# Patient Record
Sex: Male | Born: 1990 | Race: White | Hispanic: No | Marital: Single
Health system: Southern US, Community
[De-identification: ages and names within clinical notes are randomized; demographics above are authoritative.]

## PROBLEM LIST (undated history)

## (undated) ENCOUNTER — Emergency Department: Discharge status: Absent without leave | Payer: Self-pay

## (undated) DIAGNOSIS — K219 Gastro-esophageal reflux disease without esophagitis: Secondary | ICD-10-CM

## (undated) DIAGNOSIS — Z8489 Family history of other specified conditions: Secondary | ICD-10-CM

## (undated) DIAGNOSIS — I10 Essential (primary) hypertension: Secondary | ICD-10-CM

## (undated) DIAGNOSIS — U071 COVID-19: Secondary | ICD-10-CM

---

## 2016-08-30 NOTE — Progress Notes (Signed)
  History of Present Illness:   Tristan Salinas is a 26 y.o. male here for burning with urination. He denies any penile discharge. He is sexually active with his male partner. He denies any abdominal pain, nausea, or vomiting. He does report fever and chills. He reports 2 similar episodes when he was younger, though no work up was done. He is engaged to his partner.   Past Medical History:  No past medical history on file.  Past Surgical History:  No past surgical history on file.  Allergies:  No Known Allergies  Current Medications:   Prior to Admission medications   Medication Sig Taking? Last Dose  cefdinir (OMNICEF) 300 mg capsule Take 1 capsule (300 mg total) by mouth 2 (two) times daily for 10 days.      Family History:  No family history on file.  Social History:   Social History   Social History  . Marital status: Single    Spouse name: N/A  . Number of children: N/A  . Years of education: N/A   Occupational History  . Not on file.   Social History Main Topics  . Smoking status: Never Smoker  . Smokeless tobacco: Never Used  . Alcohol use Not on file  . Drug use: Unknown  . Sexual activity: Not on file   Other Topics Concern  . Not on file   Social History Narrative  . No narrative on file    Review of Systems:   Review of Systems  Constitutional: Negative for chills and fever.  HENT: Negative for congestion.   Eyes: Negative for visual disturbance.  Respiratory: Negative for cough and shortness of breath.   Gastrointestinal: Negative for abdominal pain, nausea and vomiting.  Genitourinary: Positive for dysuria, frequency and urgency. Negative for discharge and penile pain.  Skin: Negative for rash.    Vitals:   Vitals:   08/30/16 0852  BP: 149/87  Pulse: 94  Temp: 37 C (98.6 F)  SpO2: 100%  Weight: 93 kg (205 lb)     Body mass index is 27.05 kg/m.  Physical Exam:   Physical Exam  Constitutional: He is oriented to person, place,  and time. He appears well-developed and well-nourished. No distress.  HENT:  Head: Normocephalic and atraumatic.  Eyes: Conjunctivae and EOM are normal. Pupils are equal, round, and reactive to light.  Cardiovascular: Normal rate and regular rhythm.   Pulmonary/Chest: Effort normal and breath sounds normal.  Abdominal: Soft. Normal appearance and bowel sounds are normal. There is tenderness in the suprapubic area. There is no rigidity, no rebound, no guarding and no CVA tenderness.  Neurological: He is alert and oriented to person, place, and time.  Skin: Skin is warm and dry. He is not diaphoretic.  Psychiatric: He has a normal mood and affect. His behavior is normal. Judgment and thought content normal.  Nursing note and vitals reviewed.   Assessment and Plan:   Tristan Salinas was seen today for fever, chills, dysuria and tingling.  Diagnoses and all orders for this visit:  Dysuria -     Urinalysis w/Microscopic -     Urine Culture, Routine - [facility]  Acute UTI  Other orders -     cefdinir (OMNICEF) 300 mg capsule; Take 1 capsule (300 mg total) by mouth 2 (two) times daily for 10 days.     Portions of this note were created using dictation software and may contain typographical errors.   Patient received an After Visit Summary

## 2019-07-15 ENCOUNTER — Encounter: Payer: Self-pay | Admitting: Emergency Medicine

## 2019-07-15 ENCOUNTER — Other Ambulatory Visit: Payer: Self-pay

## 2019-07-15 ENCOUNTER — Emergency Department
Admission: EM | Admit: 2019-07-15 | Discharge: 2019-07-15 | Disposition: A | Discharge status: Home / Self Care | Payer: Self-pay | Attending: Emergency Medicine | Admitting: Emergency Medicine

## 2019-07-15 ENCOUNTER — Emergency Department: Payer: Self-pay

## 2019-07-15 DIAGNOSIS — F17210 Nicotine dependence, cigarettes, uncomplicated: Secondary | ICD-10-CM | POA: Insufficient documentation

## 2019-07-15 DIAGNOSIS — Z8616 Personal history of COVID-19: Secondary | ICD-10-CM | POA: Insufficient documentation

## 2019-07-15 DIAGNOSIS — K2100 Gastro-esophageal reflux disease with esophagitis, without bleeding: Secondary | ICD-10-CM | POA: Insufficient documentation

## 2019-07-15 DIAGNOSIS — Z79899 Other long term (current) drug therapy: Secondary | ICD-10-CM | POA: Insufficient documentation

## 2019-07-15 DIAGNOSIS — R12 Heartburn: Secondary | ICD-10-CM | POA: Insufficient documentation

## 2019-07-15 HISTORY — DX: COVID-19: U07.1

## 2019-07-15 LAB — CBC
HCT: 47.4 % (ref 39.0–52.0)
Hemoglobin: 15.9 g/dL (ref 13.0–17.0)
MCH: 29.7 pg (ref 26.0–34.0)
MCHC: 33.5 g/dL (ref 30.0–36.0)
MCV: 88.6 fL (ref 80.0–100.0)
Platelets: 242 10*3/uL (ref 150–400)
RBC: 5.35 MIL/uL (ref 4.22–5.81)
RDW: 12.3 % (ref 11.5–15.5)
WBC: 7.8 10*3/uL (ref 4.0–10.5)
nRBC: 0 % (ref 0.0–0.2)

## 2019-07-15 LAB — HEPATIC FUNCTION PANEL
ALT: 76 U/L — ABNORMAL HIGH (ref 0–44)
AST: 35 U/L (ref 15–41)
Albumin: 4.8 g/dL (ref 3.5–5.0)
Alkaline Phosphatase: 55 U/L (ref 38–126)
Bilirubin, Direct: <0.1 mg/dL (ref 0.0–0.2)
Total Bilirubin: 0.6 mg/dL (ref 0.3–1.2)
Total Protein: 8.4 g/dL — ABNORMAL HIGH (ref 6.5–8.1)

## 2019-07-15 LAB — LIPASE, BLOOD: Lipase: 31 U/L (ref 11–51)

## 2019-07-15 LAB — BASIC METABOLIC PANEL
Anion gap: 8 (ref 5–15)
BUN: 9 mg/dL (ref 6–20)
CO2: 29 mmol/L (ref 22–32)
Calcium: 9.8 mg/dL (ref 8.9–10.3)
Chloride: 104 mmol/L (ref 98–111)
Creatinine, Ser: 0.75 mg/dL (ref 0.61–1.24)
GFR calc Af Amer: >60 mL/min (ref >60)
GFR calc non Af Amer: >60 mL/min (ref >60)
Glucose, Bld: 112 mg/dL — ABNORMAL HIGH (ref 70–99)
Potassium: 3.9 mmol/L (ref 3.5–5.1)
Sodium: 141 mmol/L (ref 135–145)

## 2019-07-15 LAB — TROPONIN I (HIGH SENSITIVITY)
Troponin I (High Sensitivity): <2 ng/L (ref <18)
Troponin I (High Sensitivity): <2 ng/L (ref <18)

## 2019-07-15 MED ORDER — FAMOTIDINE 20 MG PO TABS
40.0000 mg | ORAL_TABLET | Freq: Once | ORAL | Status: AC
  Administered 2019-07-15: 40 mg via ORAL
  Filled 2019-07-15: qty 2

## 2019-07-15 MED ORDER — ONDANSETRON HCL 4 MG/2ML IJ SOLN
4.0000 mg | Freq: Once | INTRAMUSCULAR | Status: AC
  Administered 2019-07-15: 16:00:00 4 mg via INTRAVENOUS
  Filled 2019-07-15: qty 2

## 2019-07-15 MED ORDER — PANTOPRAZOLE SODIUM 40 MG IV SOLR
40.0000 mg | Freq: Once | INTRAVENOUS | Status: AC
  Administered 2019-07-15: 16:00:00 40 mg via INTRAVENOUS
  Filled 2019-07-15: qty 40

## 2019-07-15 MED ORDER — ONDANSETRON HCL 4 MG PO TABS: 4.0000 mg | ORAL_TABLET | Freq: Every day | ORAL | 0 refills | Status: AC | PRN

## 2019-07-15 MED ORDER — ALUM & MAG HYDROXIDE-SIMETH 200-200-20 MG/5ML PO SUSP
30.0000 mL | Freq: Once | ORAL | Status: AC
  Administered 2019-07-15: 16:00:00 30 mL via ORAL
  Filled 2019-07-15: qty 30

## 2019-07-15 MED ORDER — LIDOCAINE VISCOUS HCL 2 % MT SOLN
15.0000 mL | Freq: Once | OROMUCOSAL | Status: AC
  Administered 2019-07-15: 15 mL via ORAL
  Filled 2019-07-15: qty 15

## 2019-07-15 MED ORDER — OMEPRAZOLE 20 MG PO CPDR: 20.0000 mg | DELAYED_RELEASE_CAPSULE | Freq: Every day | ORAL | 0 refills | Status: DC

## 2019-07-15 NOTE — ED Notes (Signed)
FIRST NURSE NOTE: Pt seen here today for heart burn sxs, returns again for same sxs states the medication wore off and pain returned.

## 2019-07-15 NOTE — ED Notes (Signed)
Pt transported to Xray. 

## 2019-07-15 NOTE — ED Notes (Signed)
Pt states that he is no longer in pain and denies nausea at this time. Dr. Penne Lash, MD made aware.

## 2019-07-15 NOTE — ED Notes (Signed)
Pt presents to the ED for CP in the central part of the pt's chest since last night. Denies radiation. Pt states he has tried many OTC heartburn medication that would work temporarily and it comes right back. Pt also c/o NVD.

## 2019-07-15 NOTE — ED Notes (Addendum)
PER DR. ISAACS, PT DOES NOT NEED ANY PROTOCOLS AT THIS TIME.

## 2019-07-15 NOTE — ED Notes (Signed)
FIRST NURSE NOTE:  Pt here c/o heart burn since last night/early this morning, reports worse pain with ambulation.     Also of note, when patient was being registered by registration, there was a patient with the same name and DOB but the address listed was not a previous address the patient lived at. Pt was asked several times by this RN and by registration to verify the address but pt was not familiar with the address on file.  Pt was given new MRN with ID that was provided.

## 2019-07-15 NOTE — Discharge Instructions (Addendum)
Start the following regimen. You can purchase these medications over-the-counter, or I have written a paper prescription:  Start Omeprazole 20 mg once daily. Take this first thing in the AM before eating.  Take Tums or other over-the-counter antacid as needed for worsening heartburn  Eat frequent, small meals rather than large meals and avoid foods high in acid, such as juices, tomato sauce, or spicy foods  Try to not eat after 7-8 PM  Try using an extra pillow at night to help with reflux if needed

## 2019-07-15 NOTE — ED Provider Notes (Signed)
Emergency Department Provider Note  ____________________________________________  Time seen: Approximately 9:41 PM  I have reviewed the triage vital signs and the nursing notes.   HISTORY  Chief Complaint Heartburn   Historian Patient    HPI Tristan Salinas is a 29 y.o. male presents to the emergency department with return of heartburn type symptoms.  Patient reports that he was seen and evaluated earlier in the day and given medication which  helped his epigastric abdominal pain.  He states that his symptoms are not as severe as they were earlier but have returned.   Past Medical History:  Diagnosis Date  . COVID-19      Immunizations up to date:  Yes.     Past Medical History:  Diagnosis Date  . COVID-19     There are no problems to display for this patient.   History reviewed. No pertinent surgical history.  Prior to Admission medications   Medication Sig Start Date End Date Taking? Authorizing Provider  omeprazole (PRILOSEC) 20 MG capsule Take 1 capsule (20 mg total) by mouth daily for 14 days. 07/15/19 07/29/19  Duffy Bruce, MD  ondansetron (ZOFRAN) 4 MG tablet Take 1 tablet (4 mg total) by mouth daily as needed for nausea or vomiting. 07/15/19 07/14/20  Duffy Bruce, MD    Allergies Patient has no known allergies.  History reviewed. No pertinent family history.  Social History Social History   Tobacco Use  . Smoking status: Current Some Day Smoker  . Smokeless tobacco: Never Used  Substance Use Topics  . Alcohol use: Yes  . Drug use: Not on file     Review of Systems  Constitutional: No fever/chills Eyes:  No discharge ENT: No upper respiratory complaints. Respiratory: no cough. No SOB/ use of accessory muscles to breath Gastrointestinal:   No nausea, no vomiting.  No diarrhea.  No constipation. Musculoskeletal: Negative for musculoskeletal pain. Skin: Negative for rash, abrasions, lacerations,  ecchymosis.    ____________________________________________   PHYSICAL EXAM:  VITAL SIGNS: ED Triage Vitals  Enc Vitals Group     BP 07/15/19 1849 (!) 166/113     Pulse Rate 07/15/19 1849 69     Resp 07/15/19 1849 18     Temp 07/15/19 1849 98.2 F (36.8 C)     Temp Source 07/15/19 1849 Oral     SpO2 07/15/19 1849 99 %     Weight --      Height --      Head Circumference --      Peak Flow --      Pain Score 07/15/19 1854 10     Pain Loc --      Pain Edu? --      Excl. in [place]? --      Constitutional: Alert and oriented. Well appearing and in no acute distress.  Eyes: Conjunctivae are normal. PERRL. EOMI. Head: Atraumatic. Cardiovascular: Normal rate, regular rhythm. Normal S1 and S2.  Good peripheral circulation. Respiratory: Normal respiratory effort without tachypnea or retractions. Lungs CTAB. Good air entry to the bases with no decreased or absent breath sounds Gastrointestinal: Bowel sounds x 4 quadrants. Soft and nontender to palpation. No guarding or rigidity. No distention. Musculoskeletal: Full range of motion to all extremities. No obvious deformities noted Neurologic:  Normal for age. No gross focal neurologic deficits are appreciated.  Skin:  Skin is warm, dry and intact. No rash noted. Psychiatric: Mood and affect are normal for age. Speech and behavior are normal.   ____________________________________________  LABS (all labs ordered are listed, but only abnormal results are displayed)  Labs Reviewed  TROPONIN I (HIGH SENSITIVITY)  TROPONIN I (HIGH SENSITIVITY)   ____________________________________________  EKG   ____________________________________________  RADIOLOGY Geraldo Pitter, personally viewed and evaluated these images (plain radiographs) as part of my medical decision making, as well as reviewing the written report by the radiologist.  DG Chest 2 View  Result Date: 07/15/2019 CLINICAL DATA:  Chest pain EXAM: CHEST - 2 VIEW  COMPARISON:  None. FINDINGS: Lungs are clear. Heart size and pulmonary vascularity are normal. No adenopathy. No pneumothorax. No bone lesions. IMPRESSION: No abnormality noted. Electronically Signed   By: Bretta Bang III M.D.   On: 07/15/2019 16:19    ____________________________________________    PROCEDURES  Procedure(s) performed:     Procedures     Medications  famotidine (PEPCID) tablet 40 mg (40 mg Oral Given 07/15/19 2004)     ____________________________________________   INITIAL IMPRESSION / ASSESSMENT AND PLAN / ED COURSE  Pertinent labs & imaging results that were available during my care of the patient were reviewed by me and considered in my medical decision making (see chart for details).      Assessment and plan  GERD 29 year old male presents to the emergency department with return of epigastric abdominal discomfort after having an exhaustive work-up earlier in the day.  A repeat troponin was obtained during current emergency department encounter which was within reference range.  Patient was given Pepcid and he reported that his symptoms resolved.  Recommended medications prescribed by Dr. Erma Heritage for GERD.  He voiced understanding and agreed to comply with recommendations.  Return precautions were given.     ____________________________________________  FINAL CLINICAL IMPRESSION(S) / ED DIAGNOSES  Final diagnoses:  Heartburn      NEW MEDICATIONS STARTED DURING THIS VISIT:  ED Discharge Orders    None          This chart was dictated using voice recognition software/Dragon. Despite best efforts to proofread, errors can occur which can change the meaning. Any change was purely unintentional.     Gasper Lloyd 07/15/19 2143    Phineas Semen, MD 07/15/19 2221

## 2019-07-15 NOTE — ED Triage Notes (Signed)
Pt arrived via POV with SO with report of chest pain in the middle of the chest, pt states feels like heart burn and worse today with walking.

## 2019-07-15 NOTE — ED Triage Notes (Signed)
Pt states seen here earlier and was given medications that made symptoms better.  States now the heart burn pain has returned.  Pt denies new symptoms.

## 2019-07-15 NOTE — ED Provider Notes (Signed)
[facility] Emergency Department Provider Note  ____________________________________________   First MD Initiated Contact with Patient 07/15/19 1516     (approximate)  I have reviewed the triage vital signs and the nursing notes.   HISTORY  Chief Complaint Chest Pain    HPI Tristan Salinas is a 29 y.o. male  Here with abdominal pain, nausea, indigestion. Pt reports his sx started last night at around 3 AM. He ate a large chinese food meal late last night. Shortly thereafter, he developed a burning, aching, epigastric pain with radiation up to his chest. He attempted to eat lunch and it made his sx worse so he presents for evaluation. No SOB. No diaphoresis. He has a h/o acid reflux but does not take anything daily for it. No emesis, diarrhea. No blood in stools though he does occasionally note dark stool. Denies any regular NSAID or ASA use.        Past Medical History:  Diagnosis Date  . COVID-19     There are no problems to display for this patient.   History reviewed. No pertinent surgical history.  Prior to Admission medications   Medication Sig Start Date End Date Taking? Authorizing Provider  omeprazole (PRILOSEC) 20 MG capsule Take 1 capsule (20 mg total) by mouth daily for 14 days. 07/15/19 07/29/19  Duffy Bruce, MD  ondansetron (ZOFRAN) 4 MG tablet Take 1 tablet (4 mg total) by mouth daily as needed for nausea or vomiting. 07/15/19 07/14/20  Duffy Bruce, MD    Allergies Patient has no known allergies.  No family history on file.  Social History Social History   Tobacco Use  . Smoking status: Current Some Day Smoker  . Smokeless tobacco: Never Used  Substance Use Topics  . Alcohol use: Yes  . Drug use: Not on file    Review of Systems  Review of Systems  Constitutional: Positive for fatigue. Negative for chills and fever.  HENT: Negative for sore throat.   Respiratory: Negative for shortness of breath.   Cardiovascular:  Negative for chest pain.  Gastrointestinal: Positive for abdominal pain and nausea.  Genitourinary: Negative for flank pain.  Musculoskeletal: Negative for neck pain.  Skin: Negative for rash and wound.  Allergic/Immunologic: Negative for immunocompromised state.  Neurological: Negative for weakness and numbness.  Hematological: Does not bruise/bleed easily.  All other systems reviewed and are negative.    ____________________________________________  PHYSICAL EXAM:      VITAL SIGNS: ED Triage Vitals  Enc Vitals Group     BP 07/15/19 1513 (!) 177/116     Pulse Rate 07/15/19 1513 76     Resp 07/15/19 1513 20     Temp 07/15/19 1513 98 F (36.7 C)     Temp Source 07/15/19 1513 Oral     SpO2 07/15/19 1513 100 %     Weight 07/15/19 1511 220 lb (99.8 kg)     Height 07/15/19 1511 6' (1.829 m)     Head Circumference --      Peak Flow --      Pain Score 07/15/19 1511 7     Pain Loc --      Pain Edu? --      Excl. in [place]? --      Physical Exam Vitals and nursing note reviewed.  Constitutional:      General: He is not in acute distress.    Appearance: He is well-developed.  HENT:     Head: Normocephalic and atraumatic.  Eyes:  Conjunctiva/sclera: Conjunctivae normal.  Cardiovascular:     Rate and Rhythm: Normal rate and regular rhythm.     Heart sounds: Normal heart sounds. No murmur. No friction rub.  Pulmonary:     Effort: Pulmonary effort is normal. No respiratory distress.     Breath sounds: Normal breath sounds. No wheezing or rales.  Abdominal:     General: Bowel sounds are normal. There is no distension.     Palpations: Abdomen is soft.     Tenderness: There is abdominal tenderness in the epigastric area.     Comments: Mild epigastric. No RUQ TTP, neg Murphy's.  Musculoskeletal:     Cervical back: Neck supple.  Skin:    General: Skin is warm.     Capillary Refill: Capillary refill takes less than 2 seconds.  Neurological:     Mental Status: He is alert and  oriented to person, place, and time.     Motor: No abnormal muscle tone.       ____________________________________________   LABS (all labs ordered are listed, but only abnormal results are displayed)  Labs Reviewed  BASIC METABOLIC PANEL - Abnormal; Notable for the following components:      Result Value   Glucose, Bld 112 (*)    All other components within normal limits  HEPATIC FUNCTION PANEL - Abnormal; Notable for the following components:   Total Protein 8.4 (*)    ALT 76 (*)    All other components within normal limits  CBC  LIPASE, BLOOD  TROPONIN I (HIGH SENSITIVITY)  TROPONIN I (HIGH SENSITIVITY)    ____________________________________________  EKG: Normal sinus rhythm, VR 77. PR 158, QRS 106, QTc 423. No acute St elevations or depressions. No acute ischemia or infract. ________________________________________  RADIOLOGY All imaging, including plain films, CT scans, and ultrasounds, independently reviewed by me, and interpretations confirmed via formal radiology reads.  ED MD interpretation:   CXR: Clear, no focal PNA  Official radiology report(s): DG Chest 2 View  Result Date: 07/15/2019 CLINICAL DATA:  Chest pain EXAM: CHEST - 2 VIEW COMPARISON:  None. FINDINGS: Lungs are clear. Heart size and pulmonary vascularity are normal. No adenopathy. No pneumothorax. No bone lesions. IMPRESSION: No abnormality noted. Electronically Signed   By: Bretta Bang III M.D.   On: 07/15/2019 16:19    ____________________________________________  PROCEDURES   Procedure(s) performed (including Critical Care):  Procedures  ____________________________________________  INITIAL IMPRESSION / MDM / ASSESSMENT AND PLAN / ED COURSE  As part of my medical decision making, I reviewed the following data within the electronic MEDICAL RECORD NUMBER Nursing notes reviewed and incorporated, Old chart reviewed, Notes from prior ED visits, and [place] Controlled Substance Database        *[name] was evaluated in Emergency Department on 07/15/2019 for the symptoms described in the history of present illness. He was evaluated in the context of the global COVID-19 pandemic, which necessitated consideration that the patient might be at risk for infection with the SARS-CoV-2 virus that causes COVID-19. Institutional protocols and algorithms that pertain to the evaluation of patients at risk for COVID-19 are in a state of rapid change based on information released by regulatory bodies including the CDC and federal and state organizations. These policies and algorithms were followed during the patient's care in the ED.  Some ED evaluations and interventions may be delayed as a result of limited staffing during the pandemic.*     Medical Decision Making:  29 yo M with no significant past medical  history here with suspected GERD/gastritis, possibly PUD. No signs of significant active bleeding. Abdomen is soft, no focal TTP to suggest cholecystitis, appendicitis, pancreatitis. Lipase, LFTs, all normal and BUN, Hgb are at baseline making bleeding ulcer unlikely. Feels markedly improved with GI cocktail. Will treat with outpt PPI, follow-up as needed. Discussed that he may ultimately benefit from a GI follow-up if his sx persist, given the severity of his sx with young age. EKG non-ischemic, trop neg despite constant sx >6 hr, doubt ACS. EKG and tele without arrhythmia.  ____________________________________________  FINAL CLINICAL IMPRESSION(S) / ED DIAGNOSES  Final diagnoses:  Gastroesophageal reflux disease with esophagitis without hemorrhage     MEDICATIONS GIVEN DURING THIS VISIT:  Medications  alum & mag hydroxide-simeth (MAALOX/MYLANTA) 200-200-20 MG/5ML suspension 30 mL (30 mLs Oral Given 07/15/19 1600)    And  lidocaine (XYLOCAINE) 2 % viscous mouth solution 15 mL (15 mLs Oral Given 07/15/19 1600)  pantoprazole (PROTONIX) injection 40 mg (40 mg Intravenous Given 07/15/19  1604)  ondansetron (ZOFRAN) injection 4 mg (4 mg Intravenous Given 07/15/19 1604)     ED Discharge Orders         Ordered    omeprazole (PRILOSEC) 20 MG capsule  Daily     07/15/19 1601    ondansetron (ZOFRAN) 4 MG tablet  Daily PRN     07/15/19 1601           Note:  This document was prepared using Dragon voice recognition software and may include unintentional dictation errors.   Shaune Pollack, MD 07/15/19 [phone]

## 2020-05-02 ENCOUNTER — Other Ambulatory Visit: Payer: Self-pay

## 2020-05-02 ENCOUNTER — Encounter: Payer: Self-pay | Admitting: Emergency Medicine

## 2020-05-02 ENCOUNTER — Ambulatory Visit
Admission: EM | Admit: 2020-05-02 | Discharge: 2020-05-02 | Disposition: A | Discharge status: Home / Self Care | Payer: 59 | Attending: Physician Assistant | Admitting: Physician Assistant

## 2020-05-02 DIAGNOSIS — K219 Gastro-esophageal reflux disease without esophagitis: Secondary | ICD-10-CM | POA: Diagnosis present

## 2020-05-02 DIAGNOSIS — R1013 Epigastric pain: Secondary | ICD-10-CM

## 2020-05-02 HISTORY — DX: Gastro-esophageal reflux disease without esophagitis: K21.9

## 2020-05-02 LAB — COMPREHENSIVE METABOLIC PANEL
ALT: 112 U/L — ABNORMAL HIGH (ref 0–44)
AST: 43 U/L — ABNORMAL HIGH (ref 15–41)
Albumin: 4.8 g/dL (ref 3.5–5.0)
Alkaline Phosphatase: 54 U/L (ref 38–126)
Anion gap: 10 (ref 5–15)
BUN: 8 mg/dL (ref 6–20)
CO2: 27 mmol/L (ref 22–32)
Calcium: 9.7 mg/dL (ref 8.9–10.3)
Chloride: 103 mmol/L (ref 98–111)
Creatinine, Ser: 0.68 mg/dL (ref 0.61–1.24)
GFR, Estimated: >60 mL/min (ref >60)
Glucose, Bld: 95 mg/dL (ref 70–99)
Potassium: 4 mmol/L (ref 3.5–5.1)
Sodium: 140 mmol/L (ref 135–145)
Total Bilirubin: 0.8 mg/dL (ref 0.3–1.2)
Total Protein: 8.7 g/dL — ABNORMAL HIGH (ref 6.5–8.1)

## 2020-05-02 LAB — CBC WITH DIFFERENTIAL/PLATELET
Abs Immature Granulocytes: 0.03 10*3/uL (ref 0.00–0.07)
Basophils Absolute: 0 10*3/uL (ref 0.0–0.1)
Basophils Relative: 0 %
Eosinophils Absolute: 0.1 10*3/uL (ref 0.0–0.5)
Eosinophils Relative: 2 %
HCT: 47.6 % (ref 39.0–52.0)
Hemoglobin: 16 g/dL (ref 13.0–17.0)
Immature Granulocytes: 0 %
Lymphocytes Relative: 26 %
Lymphs Abs: 2.5 10*3/uL (ref 0.7–4.0)
MCH: 29 pg (ref 26.0–34.0)
MCHC: 33.6 g/dL (ref 30.0–36.0)
MCV: 86.4 fL (ref 80.0–100.0)
Monocytes Absolute: 0.6 10*3/uL (ref 0.1–1.0)
Monocytes Relative: 6 %
Neutro Abs: 6.3 10*3/uL (ref 1.7–7.7)
Neutrophils Relative %: 66 %
Platelets: 272 10*3/uL (ref 150–400)
RBC: 5.51 MIL/uL (ref 4.22–5.81)
RDW: 12.1 % (ref 11.5–15.5)
WBC: 9.6 10*3/uL (ref 4.0–10.5)
nRBC: 0 % (ref 0.0–0.2)

## 2020-05-02 LAB — LIPASE, BLOOD: Lipase: 32 U/L (ref 11–51)

## 2020-05-02 MED ORDER — ALUM & MAG HYDROXIDE-SIMETH 200-200-20 MG/5ML PO SUSP
30.0000 mL | Freq: Once | ORAL | Status: AC
  Administered 2020-05-02: 30 mL via ORAL

## 2020-05-02 MED ORDER — PANTOPRAZOLE SODIUM 40 MG PO TBEC: 40.0000 mg | DELAYED_RELEASE_TABLET | Freq: Two times a day (BID) | ORAL | 0 refills | Status: DC

## 2020-05-02 MED ORDER — LIDOCAINE VISCOUS HCL 2 % MT SOLN
15.0000 mL | Freq: Once | OROMUCOSAL | Status: AC
  Administered 2020-05-02: 15 mL via ORAL

## 2020-05-02 MED ORDER — ALUM & MAG HYDROXIDE-SIMETH 200-200-20 MG/5ML PO SUSP: 30.0000 mL | Freq: Once | ORAL | Status: DC

## 2020-05-02 NOTE — ED Provider Notes (Signed)
[facility]    CSN: [id number] Arrival date & time: 05/02/20  1410      History   Chief Complaint Chief Complaint  Patient presents with   Gastroesophageal Reflux   Abdominal Pain    HPI Tristan Salinas is a 30 y.o. male presenting for epigastric pain x 1 week. Patient has long history of GERD, diagnosed 4 years ago and generally well controlled on omeprazole 20 mg daily. He says he has been having to take 2 to 3 capsules of omeprazole daily to get relief of his acid reflux.He has also been taking Pepto Bismol recently.  Patient states that the pain is a burning sensation that is constant but worse at sometimes.  He says he occasionally vomits and that seems to relieve the pain.  He says he has had a reduced appetite since his symptoms have gotten worse.  He denies any fever, fatigue, radiation of the abdominal pain, diarrhea, hematemesis, dark or bloody stools.  He says symptoms are similar to when he has had bad flareups of his reflux in the past but seemingly worse this time.  He has never seen a GI specialist.  He denies smoking, drinking any alcohol or doing any recreational drugs.  Patient states that he does try to watch his diet and avoid trigger foods.  No other concerns.  HPI  Past Medical History:  Diagnosis Date   COVID-19    GERD (gastroesophageal reflux disease)     There are no problems to display for this patient.   History reviewed. No pertinent surgical history.     Home Medications    Prior to Admission medications   Medication Sig Start Date End Date Taking? Authorizing Provider  omeprazole (PRILOSEC) 20 MG capsule Take 1 capsule (20 mg total) by mouth daily for 14 days. 07/15/19 07/29/19 Yes Shaune Pollack, MD  pantoprazole (PROTONIX) 40 MG tablet Take 1 tablet (40 mg total) by mouth 2 (two) times daily before a meal. 05/02/20 06/01/20 Yes Eusebio Friendly B, PA-C  ondansetron (ZOFRAN) 4 MG tablet Take 1 tablet (4 mg total) by mouth daily as  needed for nausea or vomiting. 07/15/19 07/14/20  Shaune Pollack, MD    Family History History reviewed. No pertinent family history.  Social History Social History   Tobacco Use   Smoking status: Former Smoker   Smokeless tobacco: Never Used  Building services engineer Use: Never used  Substance Use Topics   Alcohol use: Yes   Drug use: Not Currently     Allergies   Patient has no known allergies.   Review of Systems Review of Systems  Constitutional: Positive for appetite change. Negative for fatigue and fever.  Respiratory: Negative for cough and shortness of breath.   Cardiovascular: Negative for chest pain.  Gastrointestinal: Positive for abdominal pain, nausea and vomiting. Negative for blood in stool, constipation and diarrhea.  Musculoskeletal: Negative for back pain.  Neurological: Negative for weakness.     Physical Exam Triage Vital Signs ED Triage Vitals  Enc Vitals Group     BP 05/02/20 1459 (!) 158/88     Pulse Rate 05/02/20 1459 82     Resp 05/02/20 1459 18     Temp 05/02/20 1459 98.2 F (36.8 C)     Temp Source 05/02/20 1459 Oral     SpO2 05/02/20 1459 100 %     Weight 05/02/20 1457 220 lb 0.3 oz (99.8 kg)     Height 05/02/20 1457 6' (1.829 m)  Head Circumference --      Peak Flow --      Pain Score 05/02/20 1457 5     Pain Loc --      Pain Edu? --      Excl. in GC? --    No data found.  Updated Vital Signs BP (!) 158/88 (BP Location: Right Arm)    Pulse 82    Temp 98.2 F (36.8 C) (Oral)    Resp 18    Ht 6' (1.829 m)    Wt 220 lb 0.3 oz (99.8 kg)    SpO2 100%    BMI 29.84 kg/m       Physical Exam Vitals and nursing note reviewed.  Constitutional:      General: He is not in acute distress.    Appearance: Normal appearance. He is well-developed and well-nourished. He is not ill-appearing or toxic-appearing.  HENT:     Head: Normocephalic and atraumatic.  Eyes:     General: No scleral icterus.    Conjunctiva/sclera: Conjunctivae  normal.  Cardiovascular:     Rate and Rhythm: Normal rate and regular rhythm.     Heart sounds: Normal heart sounds.  Pulmonary:     Effort: Pulmonary effort is normal. No respiratory distress.     Breath sounds: Normal breath sounds.  Abdominal:     General: Bowel sounds are normal.     Palpations: Abdomen is soft.     Tenderness: There is no abdominal tenderness. There is no guarding.  Musculoskeletal:        General: No edema.     Cervical back: Neck supple.  Skin:    General: Skin is warm and dry.  Neurological:     General: No focal deficit present.     Mental Status: He is alert. Mental status is at baseline.     Motor: No weakness.  Psychiatric:        Mood and Affect: Mood and affect and mood normal.        Behavior: Behavior normal.        Thought Content: Thought content normal.      UC Treatments / Results  Labs (all labs ordered are listed, but only abnormal results are displayed) Labs Reviewed  COMPREHENSIVE METABOLIC PANEL - Abnormal; Notable for the following components:      Result Value   Total Protein 8.7 (*)    AST 43 (*)    ALT 112 (*)    All other components within normal limits  CBC WITH DIFFERENTIAL/PLATELET  LIPASE, BLOOD    EKG   Radiology No results found.  Procedures Procedures (including critical care time)  Medications Ordered in UC Medications  alum & mag hydroxide-simeth (MAALOX/MYLANTA) 200-200-20 MG/5ML suspension 30 mL (30 mLs Oral Given 05/02/20 1610)    And  lidocaine (XYLOCAINE) 2 % viscous mouth solution 15 mL (15 mLs Oral Given 05/02/20 1610)    Initial Impression / Assessment and Plan / UC Course  I have reviewed the triage vital signs and the nursing notes.  Pertinent labs & imaging results that were available during my care of the patient were reviewed by me and considered in my medical decision making (see chart for details).   30 year old male with known history of GERD presenting for worsening of GERD symptoms  over the past 6 to 7 days.  In the clinic, blood pressure is elevated at 158/88 and the rest of the vital signs are stable.  He is in no acute  distress.  His exam is completely within normal limits including abdominal exam.  He has no tenderness on exam.  Patient given GI cocktail of Maalox/Mylanta and viscous lidocaine  CBC, CMP and lipase obtained today.  Assessing for signs of bleeding ulcer, gallbladder disease, and pancreatitis.  CBC is normal.  Lipase within normal limits.  CMP reveals elevated ALT at 112.  AST is borderline high at 43.  All other labs are normal.  Discussed results with patient.  Treating patient at this time with pantoprazole for severe GERD.  Also discussed identifying trigger foods and ways to reduce risk of worsening GERD.  Patient given contact information for  GI since he likely needs an EGD at this point. Reviewed ED precautions with patient.   Final Clinical Impressions(s) / UC Diagnoses   Final diagnoses:  Abdominal pain, epigastric  Gastroesophageal reflux disease without esophagitis     Discharge Instructions     Begin pantoprazole and stop the omeprazole.  Try to identify trigger foods and avoid them.  Increase rest and fluids.  Eat smaller meals and do not eat 3 hours before bedtime.  To emergency department if you have any fever, severe worsening abdominal pain, you vomit blood or have dark stools.   GI (Gastroenterology) Phone: [phone] Please contact the following number for appointment with GI     ED Prescriptions    Medication Sig Dispense Auth. Provider   pantoprazole (PROTONIX) 40 MG tablet Take 1 tablet (40 mg total) by mouth 2 (two) times daily before a meal. 60 tablet Shirlee Latch, PA-C     PDMP not reviewed this encounter.   Shirlee Latch, PA-C 05/02/20 [phone]

## 2020-05-02 NOTE — Discharge Instructions (Addendum)
Begin pantoprazole and stop the omeprazole.  Try to identify trigger foods and avoid them.  Increase rest and fluids.  Eat smaller meals and do not eat 3 hours before bedtime.  To emergency department if you have any fever, severe worsening abdominal pain, you vomit blood or have dark stools.  [facility] (Gastroenterology) Phone: [phone] Please contact the following number for appointment with GI

## 2020-05-02 NOTE — ED Triage Notes (Signed)
Pt c/o epigastric pain. Started about 6 days ago. He states he had this same pain when he was first diagnosed with GERD. He is taking Omeprazole and pepto.

## 2020-06-19 ENCOUNTER — Other Ambulatory Visit: Payer: Self-pay

## 2020-06-19 ENCOUNTER — Ambulatory Visit (INDEPENDENT_AMBULATORY_CARE_PROVIDER_SITE_OTHER): Payer: 59 | Admitting: Gastroenterology

## 2020-06-19 ENCOUNTER — Encounter: Payer: Self-pay | Admitting: Gastroenterology

## 2020-06-19 VITALS — BP 148/99 | HR 83 | Ht 72.0 in | Wt 215.8 lb

## 2020-06-19 DIAGNOSIS — R109 Unspecified abdominal pain: Secondary | ICD-10-CM

## 2020-06-19 DIAGNOSIS — R748 Abnormal levels of other serum enzymes: Secondary | ICD-10-CM | POA: Diagnosis not present

## 2020-06-19 NOTE — Progress Notes (Unsigned)
Tristan Salinas [address]  [place], [place] [address]  Main: [phone]  Fax: [phone]   Gastroenterology Consultation  Referring Provider:     Sharilyn Sites Primary Care Physician:  Patient, No Pcp Per Reason for Consultation:    Abdominal pain        HPI:    Chief Complaint  Patient presents with  . New Patient (Initial Visit)    Hosp. Follow up    Tristan Salinas is a 30 y.o. y/o male referred for consultation & management  by Dr. Patient, No Pcp Per.  Patient reports abdominal pain, epigastric region, associated with burning sensation in chest, for the last 2 years, intermittent, dull, 5/10, nonradiating, associated with nausea.  Symptoms have worsened over the last 3 to 4 months.  Patient went to the ER for this and was discharged with PPI and conservative management.  Patient was taking PPI twice daily for about a month and states symptoms were much better when he was on the medication and when he ran out, symptoms have started to return.  No dysphagia.  No prior upper endoscopy.  No weight loss.  No prior colonoscopy.  No family history of GI malignancy.  Past Medical History:  Diagnosis Date  . COVID-19   . GERD (gastroesophageal reflux disease)     No past surgical history on file.  Prior to Admission medications   Medication Sig Start Date End Date Taking? Authorizing Provider  ondansetron (ZOFRAN) 4 MG tablet Take 1 tablet (4 mg total) by mouth daily as needed for nausea or vomiting. 07/15/19 07/14/20 Yes Duffy Bruce, MD  omeprazole (PRILOSEC) 20 MG capsule Take 1 capsule (20 mg total) by mouth daily for 14 days. 07/15/19 07/29/19  Duffy Bruce, MD  pantoprazole (PROTONIX) 40 MG tablet Take 1 tablet (40 mg total) by mouth 2 (two) times daily before a meal. 05/02/20 06/01/20  Danton Clap, PA-C    No family history on file.   Social History   Tobacco Use  . Smoking status: Former Research scientist (life sciences)  . Smokeless tobacco: Never Used  Vaping  Use  . Vaping Use: Never used  Substance Use Topics  . Alcohol use: Yes  . Drug use: Not Currently    Allergies as of 06/19/2020  . (No Known Allergies)    Review of Systems:    All systems reviewed and negative except where noted in HPI.   Physical Exam:  BP (!) 148/99 (BP Location: Left Arm, Patient Position: Sitting, Cuff Size: Large)   Pulse 83   Ht 6' (1.829 m)   Wt 215 lb 12.8 oz (97.9 kg)   BMI 29.27 kg/m  No LMP for male patient. Psych:  Alert and cooperative. Normal mood and affect. General:   Alert,  Well-developed, well-nourished, pleasant and cooperative in NAD Head:  Normocephalic and atraumatic. Eyes:  Sclera clear, no icterus.   Conjunctiva pink. Ears:  Normal auditory acuity. Nose:  No deformity, discharge, or lesions. Mouth:  No deformity or lesions,oropharynx pink & moist. Neck:  Supple; no masses or thyromegaly. Abdomen:  Normal bowel sounds.  No bruits.  Soft, non-tender and non-distended without masses, hepatosplenomegaly or hernias noted.  No guarding or rebound tenderness.    Msk:  Symmetrical without gross deformities. Good, equal movement & strength bilaterally. Pulses:  Normal pulses noted. Extremities:  No clubbing or edema.  No cyanosis. Neurologic:  Alert and oriented x3;  grossly normal neurologically. Skin:  Intact without significant lesions or rashes.  No jaundice. Lymph Nodes:  No significant cervical adenopathy. Psych:  Alert and cooperative. Normal mood and affect.   Labs: CBC    Component Value Date/Time   WBC 9.6 05/02/2020 1610   RBC 5.51 05/02/2020 1610   HGB 16.0 05/02/2020 1610   HCT 47.6 05/02/2020 1610   PLT 272 05/02/2020 1610   MCV 86.4 05/02/2020 1610   MCH 29.0 05/02/2020 1610   MCHC 33.6 05/02/2020 1610   RDW 12.1 05/02/2020 1610   LYMPHSABS 2.5 05/02/2020 1610   MONOABS 0.6 05/02/2020 1610   EOSABS 0.1 05/02/2020 1610   BASOSABS 0.0 05/02/2020 1610   CMP     Component Value Date/Time   NA 140 05/02/2020 1610    K 4.0 05/02/2020 1610   CL 103 05/02/2020 1610   CO2 27 05/02/2020 1610   GLUCOSE 95 05/02/2020 1610   BUN 8 05/02/2020 1610   CREATININE 0.68 05/02/2020 1610   CALCIUM 9.7 05/02/2020 1610   PROT 8.7 (H) 05/02/2020 1610   ALBUMIN 4.8 05/02/2020 1610   AST 43 (H) 05/02/2020 1610   ALT 112 (H) 05/02/2020 1610   ALKPHOS 54 05/02/2020 1610   BILITOT 0.8 05/02/2020 1610   GFRNONAA >60 05/02/2020 1610   GFRAA >60 07/15/2019 1534    Imaging Studies: No results found.  Assessment and Plan:   Tristan Salinas is a 30 y.o. y/o male has been referred for abdominal pain  Patient has had elevated transaminases without a clear etiology He denies any history of daily or heavy alcohol use Denies over-the-counter herbal products or supplements.  Does drink 1 cup of chamomile or herbal tea daily.  No heavy Tylenol use.  Given his abdominal pain, elevated liver enzymes, will get right upper quadrant ultrasound.  However, normal total bilirubin and alk phos is reassuring  If right upper quadrant ultrasound does not show any gallbladder etiology of his symptoms, refill PPI versus proceed with EGD  Obtain labs for elevated liver enzymes as well  Patient advised to stay away from hepatotoxic drugs and this was discussed in detail  Patient agreeable with above plan  No alarm symptoms present to indicate urgent endoscopy    Dr Tristan Salinas  Speech recognition software was used to dictate the above note.

## 2020-06-19 NOTE — Patient Instructions (Signed)
Please make sure to be fasting 4 hours before your image.

## 2020-06-24 LAB — CERULOPLASMIN: Ceruloplasmin: 22.8 mg/dL (ref 16.0–31.0)

## 2020-06-24 LAB — HEPATIC FUNCTION PANEL
ALT: 128 IU/L — ABNORMAL HIGH (ref 0–44)
AST: 50 IU/L — ABNORMAL HIGH (ref 0–40)
Albumin: 5.2 g/dL (ref 4.1–5.2)
Alkaline Phosphatase: 66 IU/L (ref 44–121)
Bilirubin Total: 0.4 mg/dL (ref 0.0–1.2)
Bilirubin, Direct: 0.14 mg/dL (ref 0.00–0.40)
Total Protein: 7.9 g/dL (ref 6.0–8.5)

## 2020-06-24 LAB — MITOCHONDRIAL/SMOOTH MUSCLE AB PNL
Mitochondrial Ab: 28 Units — ABNORMAL HIGH (ref 0.0–20.0)
Smooth Muscle Ab: 10 Units (ref 0–19)

## 2020-06-24 LAB — ANTI-MICROSOMAL ANTIBODY LIVER / KIDNEY: LKM1 Ab: 0.7 Units (ref 0.0–20.0)

## 2020-06-24 LAB — IRON AND TIBC
Iron Saturation: 26 % (ref 15–55)
Iron: 90 ug/dL (ref 38–169)
Total Iron Binding Capacity: 346 ug/dL (ref 250–450)
UIBC: 256 ug/dL (ref 111–343)

## 2020-06-24 LAB — HEPATITIS A ANTIBODY, TOTAL: hep A Total Ab: POSITIVE — AB

## 2020-06-24 LAB — HEPATITIS C ANTIBODY: Hep C Virus Ab: <0.1 s/co ratio (ref 0.0–0.9)

## 2020-06-24 LAB — ANA: ANA Titer 1: NEGATIVE

## 2020-06-24 LAB — HEPATITIS B SURFACE ANTIBODY,QUALITATIVE: Hep B Surface Ab, Qual: NONREACTIVE

## 2020-06-24 LAB — HEPATITIS B SURFACE ANTIGEN: Hepatitis B Surface Ag: NEGATIVE

## 2020-06-24 LAB — HEPATITIS B CORE ANTIBODY, TOTAL: Hep B Core Total Ab: NEGATIVE

## 2020-06-24 LAB — FERRITIN: Ferritin: 188 ng/mL (ref 30–400)

## 2020-06-24 LAB — IGG: IgG (Immunoglobin G), Serum: 1343 mg/dL (ref 603–1613)

## 2020-07-03 ENCOUNTER — Ambulatory Visit: Payer: 59

## 2020-09-24 ENCOUNTER — Encounter: Payer: Self-pay | Admitting: Gastroenterology

## 2020-09-24 ENCOUNTER — Other Ambulatory Visit: Payer: Self-pay

## 2020-09-24 ENCOUNTER — Ambulatory Visit (INDEPENDENT_AMBULATORY_CARE_PROVIDER_SITE_OTHER): Payer: 59 | Admitting: Gastroenterology

## 2020-09-24 VITALS — BP 136/76 | HR 73 | Temp 98.0°F | Ht 72.0 in | Wt 207.2 lb

## 2020-09-24 DIAGNOSIS — R109 Unspecified abdominal pain: Secondary | ICD-10-CM | POA: Diagnosis not present

## 2020-09-24 DIAGNOSIS — R748 Abnormal levels of other serum enzymes: Secondary | ICD-10-CM

## 2020-09-24 MED ORDER — PANTOPRAZOLE SODIUM 40 MG PO TBEC: 40.0000 mg | DELAYED_RELEASE_TABLET | Freq: Two times a day (BID) | ORAL | 1 refills | Status: DC

## 2020-09-24 NOTE — Progress Notes (Signed)
Melodie Bouillon, MD [address]  [address]  [place], [place] [address]  Main: [phone]  Fax: [phone]   Primary Care Physician: Marina Goodell, MD   Chief Complaint  Patient presents with   Follow-up    Elevated liver enzymes  Abdominal pain, heartburn  HPI: Tristan Salinas is a 30 y.o. male here for follow-up of elevated liver enzymes.  Antimitochondrial antibody was positive and right upper quadrant ultrasound was ordered but has not been done yet.  Patient reports epigastric pain and heartburn and has been taking pantoprazole. Dull, 5/10, nonradiating, not associated with any nausea or vomiting.  With the pantoprazole he states his heartburn is better, but he does wake up in the mornings, with epigastric pain and some heartburn.  No dysphagia.  No weight loss.  Denies any pruritus or fatigue.   ROS: All ROS reviewed a negative except as per HPI   Past Medical History:  Diagnosis Date   COVID-19    GERD (gastroesophageal reflux disease)     History reviewed. No pertinent surgical history.  Prior to Admission medications   Medication Sig Start Date End Date Taking? Authorizing Provider  pantoprazole (PROTONIX) 40 MG tablet Take 1 tablet (40 mg total) by mouth 2 (two) times daily before a meal. 09/24/20 10/24/20  Pasty Spillers, MD    History reviewed. No pertinent family history.   Social History   Tobacco Use   Smoking status: Former    Pack years: 0.00   Smokeless tobacco: Never  Vaping Use   Vaping Use: Never used  Substance Use Topics   Alcohol use: Yes   Drug use: Not Currently    Allergies as of 09/24/2020   (No Known Allergies)    Physical Examination:   BP 136/76   Pulse 73   Temp 98 F (36.7 C) (Oral)   Ht 6' (1.829 m)   Wt 207 lb 3.2 oz (94 kg)   BMI 28.10 kg/m   Constitutional: General:   Alert,  Well-developed, well-nourished, pleasant and cooperative in NAD BP 136/76   Pulse 73   Temp 98 F (36.7 C)  (Oral)   Ht 6' (1.829 m)   Wt 207 lb 3.2 oz (94 kg)   BMI 28.10 kg/m   Eyes:  Sclera clear, no icterus.   Conjunctiva pink. PERRLA  Ears:  No scars, lesions or masses, Normal auditory acuity. Nose:  No deformity, discharge, or lesions. Mouth:  No deformity or lesions, oropharynx pink & moist.  Neck:  Supple; no masses or thyromegaly.  Respiratory: Normal respiratory effort, Normal percussion  Gastrointestinal:  Normal bowel sounds.  No bruits.  Soft, non-tender and non-distended without masses, hepatosplenomegaly or hernias noted.  No guarding or rebound tenderness.     Cardiac: No clubbing or edema.  No cyanosis. Normal posterior tibial pedal pulses noted.  Lymphatic:  No significant cervical or axillary adenopathy.  Psych:  Alert and cooperative. Normal mood and affect.  Musculoskeletal:  Normal gait. Head normocephalic, atraumatic. Symmetrical without gross deformities. 5/5 Upper and Lower extremity strength bilaterally.  Skin: Warm. Intact without significant lesions or rashes. No jaundice.  Neurologic:  Face symmetrical, tongue midline, Normal sensation to touch;  grossly normal neurologically.  Psych:  Alert and oriented x3, Alert and cooperative. Normal mood and affect.  Labs: CMP     Component Value Date/Time   NA 140 05/02/2020 1610   K 4.0 05/02/2020 1610   CL 103 05/02/2020 1610   CO2 27 05/02/2020  1610   GLUCOSE 95 05/02/2020 1610   BUN 8 05/02/2020 1610   CREATININE 0.68 05/02/2020 1610   CALCIUM 9.7 05/02/2020 1610   PROT 7.9 06/19/2020 1524   ALBUMIN 5.2 06/19/2020 1524   AST 50 (H) 06/19/2020 1524   ALT 128 (H) 06/19/2020 1524   ALKPHOS 66 06/19/2020 1524   BILITOT 0.4 06/19/2020 1524   GFRNONAA >60 05/02/2020 1610   GFRAA >60 07/15/2019 1534   Lab Results  Component Value Date   WBC 9.6 05/02/2020   HGB 16.0 05/02/2020   HCT 47.6 05/02/2020   MCV 86.4 05/02/2020   PLT 272 05/02/2020    Imaging Studies:    Assessment and Plan:    Tristan Salinas is a 30 y.o. y/o male here for follow-up of abdominal pain, heartburn, elevated liver enzymes  Given ongoing heartburn and epigastric pain which may be related to GERD as it did improve somewhat with Protonix, but not completely, will increase Protonix to twice a day  (Risks of PPI use were discussed with patient including bone loss, C. Diff diarrhea, pneumonia, infections, CKD, electrolyte abnormalities.  Pt. Verbalizes understanding and chooses to continue the medication.)  If symptoms no better in 2 weeks patient advised to call us back  Given his elevated liver enzymes and positive antimitochondrial antibody, he will need right upper quadrant ultrasound.  Ordered and our clinic will work on getting this scheduled  I will also repeat liver enzymes at this time since last ones were about 5 to 6 months ago    Dr Melodie Bouillon

## 2020-09-25 LAB — COMPREHENSIVE METABOLIC PANEL
ALT: 84 IU/L — ABNORMAL HIGH (ref 0–44)
AST: 34 IU/L (ref 0–40)
Albumin/Globulin Ratio: 1.8 (ref 1.2–2.2)
Albumin: 4.9 g/dL (ref 4.1–5.2)
Alkaline Phosphatase: 66 IU/L (ref 44–121)
BUN/Creatinine Ratio: 13 (ref 9–20)
BUN: 12 mg/dL (ref 6–20)
Bilirubin Total: 0.5 mg/dL (ref 0.0–1.2)
CO2: 24 mmol/L (ref 20–29)
Calcium: 9.9 mg/dL (ref 8.7–10.2)
Chloride: 102 mmol/L (ref 96–106)
Creatinine, Ser: 0.89 mg/dL (ref 0.76–1.27)
Globulin, Total: 2.8 g/dL (ref 1.5–4.5)
Glucose: 85 mg/dL (ref 65–99)
Potassium: 4.7 mmol/L (ref 3.5–5.2)
Sodium: 141 mmol/L (ref 134–144)
Total Protein: 7.7 g/dL (ref 6.0–8.5)
eGFR: 119 mL/min/{1.73_m2} (ref >59)

## 2020-10-27 ENCOUNTER — Ambulatory Visit
Admission: RE | Admit: 2020-10-27 | Discharge: 2020-10-27 | Disposition: A | Discharge status: Home / Self Care | Payer: 59 | Source: Ambulatory Visit | Attending: Gastroenterology | Admitting: Gastroenterology

## 2020-10-27 ENCOUNTER — Other Ambulatory Visit: Payer: Self-pay

## 2020-10-27 DIAGNOSIS — R109 Unspecified abdominal pain: Secondary | ICD-10-CM | POA: Insufficient documentation

## 2020-10-28 ENCOUNTER — Other Ambulatory Visit: Payer: Self-pay | Admitting: Gastroenterology

## 2020-10-28 DIAGNOSIS — R748 Abnormal levels of other serum enzymes: Secondary | ICD-10-CM

## 2020-11-03 ENCOUNTER — Encounter: Payer: Self-pay | Admitting: Gastroenterology

## 2020-11-28 ENCOUNTER — Other Ambulatory Visit: Payer: Self-pay | Admitting: Gastroenterology

## 2020-12-02 ENCOUNTER — Encounter: Payer: Self-pay | Admitting: Gastroenterology

## 2020-12-02 ENCOUNTER — Ambulatory Visit (INDEPENDENT_AMBULATORY_CARE_PROVIDER_SITE_OTHER): Payer: 59 | Admitting: Gastroenterology

## 2020-12-02 ENCOUNTER — Other Ambulatory Visit: Payer: Self-pay

## 2020-12-02 VITALS — BP 133/86 | HR 73 | Temp 98.1°F | Ht 72.0 in | Wt 208.8 lb

## 2020-12-02 DIAGNOSIS — R1013 Epigastric pain: Secondary | ICD-10-CM | POA: Diagnosis not present

## 2020-12-02 DIAGNOSIS — R7401 Elevation of levels of liver transaminase levels: Secondary | ICD-10-CM | POA: Diagnosis not present

## 2020-12-02 MED ORDER — OMEPRAZOLE 20 MG PO CPDR: 20.0000 mg | DELAYED_RELEASE_CAPSULE | Freq: Two times a day (BID) | ORAL | 0 refills | Status: DC

## 2020-12-02 NOTE — Addendum Note (Signed)
Addended by: Roena Malady on: 12/02/2020 04:17 PM   Modules accepted: Orders, SmartSet

## 2020-12-02 NOTE — Progress Notes (Signed)
Tristan Antigua, MD [address]  [place]  [place], [place] [address]  Main: [phone]  Fax: [phone]   Primary Care Physician: Tristan Hartigan, MD   Chief Complaint  Patient presents with   Follow-up    Pt reports increase epigastric pain x 1-2 weeks... pt states Protonix BID was no longer helping with Sx and stopped taking x 1 week as he was concerned with damage to liver...     HPI: Tristan Salinas is a 30 y.o. male here for follow-up of epigastric pain.  Patient was taking Protonix twice daily that he states was helping his symptoms.  However, a week ago he reports epigastric pain came back so he stopped taking the medication completely.  Describes burning sensation epigastric region and heartburn that occurs usually at nighttime when he is laying down and wakes him up from sleep and he has to sit up and this helps his symptoms.  No dysphagia.  No weight loss.  No nausea or vomiting.  Sleeps on a flat bed  Denies any NSAID use . No altered bowel habits.  Does take NyQuil at night and was advised to look at the bottle when he gets home to ensure that there are no ibuprofen or NSAIDs in it and if so, avoid taking it.   ROS: All ROS reviewed and negative except as per HPI   Past Medical History:  Diagnosis Date   COVID-19    GERD (gastroesophageal reflux disease)     No past surgical history on file.  Prior to Admission medications   Medication Sig Start Date End Date Taking? Authorizing Provider  omeprazole (PRILOSEC) 20 MG capsule Take 1 capsule (20 mg total) by mouth 2 (two) times daily. 12/02/20 01/01/21 Yes Tristan Manifold, MD    No family history on file.   Social History   Tobacco Use   Smoking status: Former   Smokeless tobacco: Never  Scientific laboratory technician Use: Never used  Substance Use Topics   Alcohol use: Yes   Drug use: Not Currently    Allergies as of 12/02/2020   (No Known Allergies)    Physical  Examination:  Constitutional: General:   Alert,  Well-developed, well-nourished, pleasant and cooperative in NAD BP 133/86   Pulse 73   Temp 98.1 F (36.7 C) (Oral)   Ht 6' (1.829 m)   Wt 208 lb 12.8 oz (94.7 kg)   BMI 28.32 kg/m   Respiratory: Normal respiratory effort  Gastrointestinal:  Soft, non-tender and non-distended without masses, hepatosplenomegaly or hernias noted.  No guarding or rebound tenderness.     Cardiac: No clubbing or edema.  No cyanosis. Normal posterior tibial pedal pulses noted.  Psych:  Alert and cooperative. Normal mood and affect.  Musculoskeletal:  Normal gait. Head normocephalic, atraumatic. Symmetrical without gross deformities. 5/5 Lower extremity strength bilaterally.  Skin: Warm. Intact without significant lesions or rashes. No jaundice.  Neck: Supple, trachea midline  Lymph: No cervical lymphadenopathy  Psych:  Alert and oriented x3, Alert and cooperative. Normal mood and affect.  Labs: CMP     Component Value Date/Time   NA 141 09/24/2020 1543   K 4.7 09/24/2020 1543   CL 102 09/24/2020 1543   CO2 24 09/24/2020 1543   GLUCOSE 85 09/24/2020 1543   GLUCOSE 95 05/02/2020 1610   BUN 12 09/24/2020 1543   CREATININE 0.89 09/24/2020 1543   CALCIUM 9.9 09/24/2020 1543   PROT 7.7 09/24/2020 1543   ALBUMIN  4.9 09/24/2020 1543   AST 34 09/24/2020 1543   ALT 84 (H) 09/24/2020 1543   ALKPHOS 66 09/24/2020 1543   BILITOT 0.5 09/24/2020 1543   GFRNONAA >60 05/02/2020 1610   GFRAA >60 07/15/2019 1534   Lab Results  Component Value Date   WBC 9.6 05/02/2020   HGB 16.0 05/02/2020   HCT 47.6 05/02/2020   MCV 86.4 05/02/2020   PLT 272 05/02/2020   November 17, 2020 AST normal at 26, ALT 62,  TSH normal 1.69 CBC Hemoglobin 14.9, normal platelet count at 205   Imaging Studies:   Assessment and Plan:   [name] is a 30 y.o. y/o male here for follow up of epigastric pain due to GERD  Symptoms were improving with PPI, however,  patient started having breakthrough symptoms a week ago and stopped taking the medication completely  Will therefore, switch from Protonix to omeprazole given breakthrough symptoms on Protonix  (Risks of PPI use were discussed with patient including bone loss, C. Diff diarrhea, pneumonia, infections, CKD, electrolyte abnormalities.  Pt. Verbalizes understanding and chooses to continue the medication.)  Patient educated extensively on acid reflux lifestyle modification, including using a bed wedge, not eating 3 hrs before bedtime, diet modifications, and handout given for the same.   EGD also indicated at this time given ongoing symptoms despite PPI therapy, and not explained by right upper quadrant ultrasound  Most recent liver enzymes show persistent elevation in ALT, although improved from before as above.  August 8 labs reviewed and otherwise reassuring with normal platelet count, hemoglobin, TSH.  Therefore, these do not explain his elevated liver enzymes  We will repeat mitochondrial antibody given elevation noted before(ordered)  His alk phos is not elevated to suggest PBC  However, if mitochondrial Ab is elevated again, may need to consider liver biopsy    Dr Tristan Salinas

## 2020-12-02 NOTE — Patient Instructions (Signed)
Please use a bed wedge at night

## 2020-12-03 ENCOUNTER — Encounter: Payer: Self-pay | Admitting: Gastroenterology

## 2020-12-05 LAB — HEPATIC FUNCTION PANEL
ALT: 60 IU/L — ABNORMAL HIGH (ref 0–44)
AST: 25 IU/L (ref 0–40)
Albumin: 5 g/dL (ref 4.1–5.2)
Alkaline Phosphatase: 73 IU/L (ref 44–121)
Bilirubin Total: 0.5 mg/dL (ref 0.0–1.2)
Bilirubin, Direct: 0.12 mg/dL (ref 0.00–0.40)
Total Protein: 7.7 g/dL (ref 6.0–8.5)

## 2020-12-05 LAB — MITOCHONDRIAL ANTIBODIES: Mitochondrial Ab: 25.3 Units — ABNORMAL HIGH (ref 0.0–20.0)

## 2020-12-08 ENCOUNTER — Other Ambulatory Visit: Payer: Self-pay

## 2020-12-08 ENCOUNTER — Encounter
Admission: RE | Disposition: A | Discharge status: Home / Self Care | Payer: Self-pay | Source: Home / Self Care | Attending: Gastroenterology

## 2020-12-08 ENCOUNTER — Encounter: Payer: Self-pay | Admitting: Gastroenterology

## 2020-12-08 ENCOUNTER — Ambulatory Visit: Payer: Self-pay | Admitting: Anesthesiology

## 2020-12-08 ENCOUNTER — Telehealth: Payer: Self-pay

## 2020-12-08 ENCOUNTER — Ambulatory Visit
Admission: RE | Admit: 2020-12-08 | Discharge: 2020-12-08 | Disposition: A | Discharge status: Home / Self Care | Payer: Self-pay | Attending: Gastroenterology | Admitting: Gastroenterology

## 2020-12-08 DIAGNOSIS — Z87891 Personal history of nicotine dependence: Secondary | ICD-10-CM | POA: Insufficient documentation

## 2020-12-08 DIAGNOSIS — K219 Gastro-esophageal reflux disease without esophagitis: Secondary | ICD-10-CM | POA: Insufficient documentation

## 2020-12-08 DIAGNOSIS — Z8616 Personal history of COVID-19: Secondary | ICD-10-CM | POA: Insufficient documentation

## 2020-12-08 DIAGNOSIS — R1013 Epigastric pain: Secondary | ICD-10-CM | POA: Insufficient documentation

## 2020-12-08 DIAGNOSIS — Z79899 Other long term (current) drug therapy: Secondary | ICD-10-CM | POA: Insufficient documentation

## 2020-12-08 DIAGNOSIS — R748 Abnormal levels of other serum enzymes: Secondary | ICD-10-CM

## 2020-12-08 DIAGNOSIS — K2950 Unspecified chronic gastritis without bleeding: Secondary | ICD-10-CM | POA: Insufficient documentation

## 2020-12-08 HISTORY — DX: Family history of other specified conditions: Z84.89

## 2020-12-08 HISTORY — PX: BIOPSY: SHX5522

## 2020-12-08 HISTORY — PX: ESOPHAGOGASTRODUODENOSCOPY (EGD) WITH PROPOFOL: SHX5813

## 2020-12-08 SURGERY — ESOPHAGOGASTRODUODENOSCOPY (EGD) WITH PROPOFOL
Anesthesia: General | Site: Throat

## 2020-12-08 MED ORDER — LIDOCAINE HCL (CARDIAC) PF 100 MG/5ML IV SOSY
PREFILLED_SYRINGE | INTRAVENOUS | Status: DC | PRN
  Administered 2020-12-08: 30 mg via INTRAVENOUS

## 2020-12-08 MED ORDER — SODIUM CHLORIDE 0.9 % IV SOLN: INTRAVENOUS | Status: DC

## 2020-12-08 MED ORDER — PROPOFOL 10 MG/ML IV BOLUS
INTRAVENOUS | Status: DC | PRN
  Administered 2020-12-08: 150 mg via INTRAVENOUS
  Administered 2020-12-08: 50 mg via INTRAVENOUS
  Administered 2020-12-08: 100 mg via INTRAVENOUS

## 2020-12-08 MED ORDER — GLYCOPYRROLATE 0.2 MG/ML IJ SOLN
INTRAMUSCULAR | Status: DC | PRN
  Administered 2020-12-08: .1 mg via INTRAVENOUS

## 2020-12-08 MED ORDER — LACTATED RINGERS IV SOLN: INTRAVENOUS | Status: DC

## 2020-12-08 SURGICAL SUPPLY — 34 items
BALLN DILATOR 10-12 8 (BALLOONS)
BALLN DILATOR 12-15 8 (BALLOONS)
BALLN DILATOR 15-18 8 (BALLOONS)
BALLN DILATOR CRE 0-12 8 (BALLOONS)
BALLN DILATOR ESOPH 8 10 CRE (MISCELLANEOUS) IMPLANT
BALLOON DILATOR 12-15 8 (BALLOONS) IMPLANT
BALLOON DILATOR 15-18 8 (BALLOONS) IMPLANT
BALLOON DILATOR CRE 0-12 8 (BALLOONS) IMPLANT
BLOCK BITE 60FR ADLT L/F GRN (MISCELLANEOUS) ×3 IMPLANT
CLIP HMST 235XBRD CATH ROT (MISCELLANEOUS) IMPLANT
CLIP RESOLUTION 360 11X235 (MISCELLANEOUS)
ELECT REM PT RETURN 9FT ADLT (ELECTROSURGICAL)
ELECTRODE REM PT RTRN 9FT ADLT (ELECTROSURGICAL) IMPLANT
FCP ESCP3.2XJMB 240X2.8X (MISCELLANEOUS)
FORCEPS BIOP RAD 4 LRG CAP 4 (CUTTING FORCEPS) ×3 IMPLANT
FORCEPS BIOP RJ4 240 W/NDL (MISCELLANEOUS)
FORCEPS ESCP3.2XJMB 240X2.8X (MISCELLANEOUS) IMPLANT
GOWN CVR UNV OPN BCK APRN NK (MISCELLANEOUS) ×4 IMPLANT
GOWN ISOL THUMB LOOP REG UNIV (MISCELLANEOUS) ×6
INJECTOR VARIJECT VIN23 (MISCELLANEOUS) IMPLANT
KIT DEFENDO VALVE AND CONN (KITS) IMPLANT
KIT PRC NS LF DISP ENDO (KITS) ×2 IMPLANT
KIT PROCEDURE OLYMPUS (KITS) ×3
MANIFOLD NEPTUNE II (INSTRUMENTS) ×3 IMPLANT
MARKER SPOT ENDO TATTOO 5ML (MISCELLANEOUS) IMPLANT
RETRIEVER NET PLAT FOOD (MISCELLANEOUS) IMPLANT
SNARE SHORT THROW 13M SML OVAL (MISCELLANEOUS) IMPLANT
SNARE SHORT THROW 30M LRG OVAL (MISCELLANEOUS) IMPLANT
SPOT EX ENDOSCOPIC TATTOO (MISCELLANEOUS)
SYR INFLATION 60ML (SYRINGE) IMPLANT
TRAP ETRAP POLY (MISCELLANEOUS) IMPLANT
VARIJECT INJECTOR VIN23 (MISCELLANEOUS)
WATER STERILE IRR 250ML POUR (IV SOLUTION) ×3 IMPLANT
WIRE CRE 18-20MM 8CM F G (MISCELLANEOUS) IMPLANT

## 2020-12-08 NOTE — Telephone Encounter (Signed)
fyi, I already talked to him about need for liver biopsy.he is agreeable, so we just need to order and schedule it

## 2020-12-08 NOTE — Anesthesia Postprocedure Evaluation (Signed)
Anesthesia Post Note  Patient: Tristan Salinas  Procedure(s) Performed: ESOPHAGOGASTRODUODENOSCOPY (EGD) WITH PROPOFOL (Throat) BIOPSY (Esophagus)     Patient location during evaluation: PACU Anesthesia Type: General Level of consciousness: awake and alert Pain management: pain level controlled Vital Signs Assessment: post-procedure vital signs reviewed and stable Respiratory status: spontaneous breathing, nonlabored ventilation, respiratory function stable and patient connected to nasal cannula oxygen Cardiovascular status: blood pressure returned to baseline and stable Postop Assessment: no apparent nausea or vomiting Anesthetic complications: no   No notable events documented.  [name] A  [name]

## 2020-12-08 NOTE — Anesthesia Preprocedure Evaluation (Signed)
Anesthesia Evaluation  Patient identified by MRN, date of birth, ID band Patient awake    Reviewed: Allergy & Precautions, NPO status , Patient's Chart, lab work & pertinent test results, reviewed documented beta blocker date and time   History of Anesthesia Complications Negative for: history of anesthetic complications  Airway Mallampati: II  TM Distance: >3 FB Neck ROM: Full    Dental   Pulmonary former smoker,    breath sounds clear to auscultation       Cardiovascular (-) angina(-) DOE  Rhythm:Regular Rate:Normal     Neuro/Psych    GI/Hepatic GERD  Controlled,  Endo/Other    Renal/GU      Musculoskeletal   Abdominal   Peds  Hematology   Anesthesia Other Findings   Reproductive/Obstetrics                             Anesthesia Physical Anesthesia Plan  ASA: 1  Anesthesia Plan: General   Post-op Pain Management:    Induction: Intravenous  PONV Risk Score and Plan: 2 and Propofol infusion, TIVA and Treatment may vary due to age or medical condition  Airway Management Planned: Natural Airway and Nasal Cannula  Additional Equipment:   Intra-op Plan:   Post-operative Plan:   Informed Consent: I have reviewed the patients History and Physical, chart, labs and discussed the procedure including the risks, benefits and alternatives for the proposed anesthesia with the patient or authorized representative who has indicated his/her understanding and acceptance.       Plan Discussed with: CRNA and Anesthesiologist  Anesthesia Plan Comments:         Anesthesia Quick Evaluation

## 2020-12-08 NOTE — Transfer of Care (Signed)
Immediate Anesthesia Transfer of Care Note  Patient: Tristan Salinas  Procedure(s) Performed: ESOPHAGOGASTRODUODENOSCOPY (EGD) WITH PROPOFOL (Throat) BIOPSY (Esophagus)  Patient Location: PACU  Anesthesia Type: General  Level of Consciousness: awake, alert  and patient cooperative  Airway and Oxygen Therapy: Patient Spontanous Breathing and Patient connected to supplemental oxygen  Post-op Assessment: Post-op Vital signs reviewed, Patient's Cardiovascular Status Stable, Respiratory Function Stable, Patent Airway and No signs of Nausea or vomiting  Post-op Vital Signs: Reviewed and stable  Complications: No notable events documented.

## 2020-12-08 NOTE — Op Note (Signed)
[facility] Gastroenterology Patient Name: Tristan Salinas Procedure Date: 12/08/2020 1:01 PM MRN: [id number] Account #: [id number] Date of Birth: 1990-04-14 Admit Type: Outpatient Age: 30 Room: [facility] OR ROOM 01 Gender: Male Note Status: Finalized Procedure:             Upper GI endoscopy Indications:           Epigastric abdominal pain Providers:             Malky Rudzinski B. Maximino Greenland MD, MD Referring MD:          Marina Goodell (Referring MD) Medicines:             Monitored Anesthesia Care Complications:         No immediate complications. Procedure:             Pre-Anesthesia Assessment:                        - Prior to the procedure, a History and Physical was                         performed, and patient medications, allergies and                         sensitivities were reviewed. The patient's tolerance                         of previous anesthesia was reviewed.                        - The risks and benefits of the procedure and the                         sedation options and risks were discussed with the                         patient. All questions were answered and informed                         consent was obtained.                        - Patient identification and proposed procedure were                         verified prior to the procedure by the physician, the                         nurse, the anesthesiologist, the anesthetist and the                         technician. The procedure was verified in the                         procedure room.                        - ASA Grade Assessment: II - A patient with mild                         systemic disease.  After obtaining informed consent, the endoscope was                         passed under direct vision. Throughout the procedure,                         the patient's blood pressure, pulse, and oxygen                         saturations were monitored  continuously. The Endoscope                         was introduced through the mouth, and advanced to the                         second part of duodenum. The upper GI endoscopy was                         accomplished with ease. The patient tolerated the                         procedure well. Findings:      The examined esophagus was normal.      The entire examined stomach was normal. Biopsies were obtained in the       gastric body, at the incisura and in the gastric antrum with cold       forceps for histology. Biopsies were taken with a cold forceps for       Helicobacter pylori testing.      There is no endoscopic evidence of inflammation or ulceration in the       entire examined stomach.      The duodenal bulb, second portion of the duodenum and examined duodenum       were normal. Impression:            - Normal esophagus.                        - Normal stomach. Biopsied.                        - Normal duodenal bulb, second portion of the duodenum                         and examined duodenum.                        - Biopsies were obtained in the gastric body, at the                         incisura and in the gastric antrum. Recommendation:        - Await pathology results.                        - Discharge patient to home (with escort).                        - Advance diet as tolerated.                        -  Continue present medications.                        - Patient has a contact number available for                         emergencies. The signs and symptoms of potential                         delayed complications were discussed with the patient.                         Return to normal activities tomorrow. Written                         discharge instructions were provided to the patient.                        - Discharge patient to home (with escort).                        - The findings and recommendations were discussed with                          the patient.                        - The findings and recommendations were discussed with                         the patient's family. Procedure Code(s):     --- Professional ---                        [phone], Esophagogastroduodenoscopy, flexible,                         transoral; with biopsy, single or multiple Diagnosis Code(s):     --- Professional ---                        R10.13, Epigastric pain CPT copyright 2019 American Medical Association. All rights reserved. The codes documented in this report are preliminary and upon coder review may  be revised to meet current compliance requirements.  Melodie Bouillon, MD Michel Bickers B. Maximino Greenland MD, MD 12/08/2020 1:18:53 PM This report has been signed electronically. Number of Addenda: 0 Note Initiated On: 12/08/2020 1:01 PM Total Procedure Duration: 0 hours 5 minutes 49 seconds  Estimated Blood Loss:  Estimated blood loss: none.      [facility]

## 2020-12-08 NOTE — Anesthesia Procedure Notes (Signed)
Date/Time: 12/08/2020 1:07 PM Performed by: Maree Krabbe, CRNA Pre-anesthesia Checklist: Patient identified, Emergency Drugs available, Suction available, Timeout performed and Patient being monitored Patient Re-evaluated:Patient Re-evaluated prior to induction Oxygen Delivery Method: Nasal cannula Placement Confirmation: positive ETCO2

## 2020-12-08 NOTE — H&P (Signed)
Melodie Bouillon, MD [address], [place], [place], [address] [address], [address], [place], [place], [address] Phone: [phone]  Fax: [phone]  Primary Care Physician:  Marina Goodell, MD   Pre-Procedure History & Physical: HPI:  Tristan Salinas is a 30 y.o. male is here for an EGD.   Past Medical History:  Diagnosis Date   COVID-19    Family history of adverse reaction to anesthesia    Mother - slow to wake   GERD (gastroesophageal reflux disease)     History reviewed. No pertinent surgical history.  Prior to Admission medications   Medication Sig Start Date End Date Taking? Authorizing Provider  losartan (COZAAR) 50 MG tablet Take 50 mg by mouth daily.   Yes [provider]  omeprazole (PRILOSEC) 20 MG capsule Take 1 capsule (20 mg total) by mouth 2 (two) times daily. 12/02/20 01/01/21 Yes Pasty Spillers, MD    Allergies as of 12/02/2020   (No Known Allergies)    History reviewed. No pertinent family history.  Social History   Socioeconomic History   Marital status: Single    Spouse name: Not on file   Number of children: Not on file   Years of education: Not on file   Highest education level: Not on file  Occupational History   Not on file  Tobacco Use   Smoking status: Former    Years: 1.00    Types: Cigarettes, E-cigarettes    Quit date: 10/10/2020    Years since quitting: 0.1   Smokeless tobacco: Never   Tobacco comments:    Smoked and vaped "off and on" for about 1 year  Vaping Use   Vaping Use: Former  Substance and Sexual Activity   Alcohol use: Not Currently   Drug use: Not Currently   Sexual activity: Not on file  Other Topics Concern   Not on file  Social History Narrative   Not on file   Social Determinants of Health   Financial Resource Strain: Not on file  Food Insecurity: Not on file  Transportation Needs: Not on file  Physical Activity: Not on file  Stress: Not on file  Social Connections:  Not on file  Intimate Partner Violence: Not on file    Review of Systems: See HPI, otherwise negative ROS  Constitutional: General:   Alert,  Well-developed, well-nourished, pleasant and cooperative in NAD BP (!) 143/98   Pulse 79   Temp 98.2 F (36.8 C) (Temporal)   Resp 20   Ht 6' (1.829 m)   Wt 93.9 kg   SpO2 100%   BMI 28.07 kg/m   Head: Normocephalic, atraumatic.   Eyes:  Sclera clear, no icterus.   Conjunctiva pink.   Mouth:  No deformity or lesions, oropharynx pink & moist.  Neck:  Supple, trachea midline  Respiratory: Normal respiratory effort  Gastrointestinal:  Soft, non-tender and non-distended without masses, hepatosplenomegaly or hernias noted.  No guarding or rebound tenderness.     Cardiac: No clubbing or edema.  No cyanosis. Normal posterior tibial pedal pulses noted.  Lymphatic:  No significant cervical adenopathy.  Psych:  Alert and cooperative. Normal mood and affect.  Musculoskeletal:   Symmetrical without gross deformities. 5/5 Lower extremity strength bilaterally.  Skin: Warm. Intact without significant lesions or rashes. No jaundice.  Neurologic:  Face symmetrical, tongue midline, Normal sensation to touch;  grossly normal neurologically.  Psych:  Alert and oriented x3, Alert and cooperative. Normal mood and affect.  Impression/Plan: Kern Alberta  Avakian is here for an EGD for Abdominal pain  Risks, benefits, limitations, and alternatives regarding the procedure have been reviewed with the patient.  Questions have been answered.  All parties agreeable.   Pasty Spillers, MD  12/08/2020, 12:41 PM

## 2020-12-09 ENCOUNTER — Encounter: Payer: Self-pay | Admitting: Gastroenterology

## 2020-12-11 LAB — SURGICAL PATHOLOGY

## 2020-12-16 NOTE — Addendum Note (Signed)
Addended by: Roena Malady on: 12/16/2020 10:22 AM   Modules accepted: Orders

## 2020-12-16 NOTE — Telephone Encounter (Signed)
What labs did you want ordered with this biopsy?

## 2020-12-18 ENCOUNTER — Encounter: Payer: Self-pay | Admitting: Gastroenterology

## 2020-12-18 NOTE — Addendum Note (Signed)
Addended by: Roena Malady on: 12/18/2020 11:51 AM   Modules accepted: Orders

## 2020-12-18 NOTE — Telephone Encounter (Signed)
Left message on voicemail letting pt know that he is scheduled at [facility] entrance for Sept 14th arrive at 7:30am for liver biopsy... Also, a nurse from their office will give him a call on Mon or Tues to go over procedure

## 2020-12-22 ENCOUNTER — Other Ambulatory Visit: Payer: Self-pay | Admitting: Radiology

## 2020-12-22 NOTE — Telephone Encounter (Signed)
Left detailed msg on VM per HIPAA  

## 2020-12-22 NOTE — Progress Notes (Signed)
Patient on schedule for liver biopsy 9/14, unable to get answer on phone with patient, thus left message to be here @ 0730, NPO after MN prior to procedure as well as driver post procedure/recovery/discharge.

## 2020-12-23 ENCOUNTER — Other Ambulatory Visit: Payer: Self-pay | Admitting: Radiology

## 2020-12-24 ENCOUNTER — Ambulatory Visit
Admission: RE | Admit: 2020-12-24 | Discharge: 2020-12-24 | Disposition: A | Discharge status: Home / Self Care | Payer: 59 | Source: Ambulatory Visit | Attending: Gastroenterology | Admitting: Gastroenterology

## 2020-12-24 ENCOUNTER — Other Ambulatory Visit: Payer: Self-pay

## 2020-12-24 DIAGNOSIS — Z79899 Other long term (current) drug therapy: Secondary | ICD-10-CM | POA: Insufficient documentation

## 2020-12-24 DIAGNOSIS — Z8616 Personal history of COVID-19: Secondary | ICD-10-CM | POA: Diagnosis not present

## 2020-12-24 DIAGNOSIS — K219 Gastro-esophageal reflux disease without esophagitis: Secondary | ICD-10-CM | POA: Insufficient documentation

## 2020-12-24 DIAGNOSIS — Z87891 Personal history of nicotine dependence: Secondary | ICD-10-CM | POA: Insufficient documentation

## 2020-12-24 DIAGNOSIS — R748 Abnormal levels of other serum enzymes: Secondary | ICD-10-CM | POA: Diagnosis not present

## 2020-12-24 HISTORY — DX: Essential (primary) hypertension: I10

## 2020-12-24 LAB — CBC
HCT: 43.7 % (ref 39.0–52.0)
Hemoglobin: 15.5 g/dL (ref 13.0–17.0)
MCH: 30.8 pg (ref 26.0–34.0)
MCHC: 35.5 g/dL (ref 30.0–36.0)
MCV: 86.9 fL (ref 80.0–100.0)
Platelets: 209 10*3/uL (ref 150–400)
RBC: 5.03 MIL/uL (ref 4.22–5.81)
RDW: 11.9 % (ref 11.5–15.5)
WBC: 5.9 10*3/uL (ref 4.0–10.5)
nRBC: 0 % (ref 0.0–0.2)

## 2020-12-24 LAB — PROTIME-INR
INR: 1 (ref 0.8–1.2)
Prothrombin Time: 12.8 seconds (ref 11.4–15.2)

## 2020-12-24 MED ORDER — FENTANYL CITRATE (PF) 100 MCG/2ML IJ SOLN
INTRAMUSCULAR | Status: AC
  Filled 2020-12-24: qty 2

## 2020-12-24 MED ORDER — MIDAZOLAM HCL 2 MG/2ML IJ SOLN
INTRAMUSCULAR | Status: DC | PRN
  Administered 2020-12-24: 1 mg via INTRAVENOUS

## 2020-12-24 MED ORDER — MIDAZOLAM HCL 2 MG/2ML IJ SOLN
INTRAMUSCULAR | Status: AC
  Filled 2020-12-24: qty 2

## 2020-12-24 MED ORDER — FENTANYL CITRATE (PF) 100 MCG/2ML IJ SOLN
INTRAMUSCULAR | Status: DC | PRN
  Administered 2020-12-24 (×2): 50 ug via INTRAVENOUS

## 2020-12-24 MED ORDER — MIDAZOLAM HCL 5 MG/5ML IJ SOLN
INTRAMUSCULAR | Status: DC | PRN
  Administered 2020-12-24: 1 mg via INTRAVENOUS

## 2020-12-24 MED ORDER — SODIUM CHLORIDE 0.9 % IV SOLN: INTRAVENOUS | Status: DC

## 2020-12-24 NOTE — H&P (Signed)
Chief Complaint: Patient was seen in consultation today for elevated mitochondrial antibody at the request of TristanVarnita Salinas  Referring Physician(s): TristanVarnita Salinas  Supervising Physician: Pernell Dupre  Patient Status: [facility] - Out-pt  History of Present Illness: Tristan Salinas is a 30 y.o. male who has been seen by GI on 8/23 with findings of elevated ALT and elevated mitochondrial antibody. Request was received for image guided non target liver biopsy. The patient has had a H&P performed within the last 30 days, all history, medications, and exam have been reviewed. The patient denies any interval changes since the H&P.  The patient denies any current abdominal pain, chest pain, shortness of breath or palpitations. He denies any known bleeding or clotting disorder. He has no known complications to sedation.    Past Medical History:  Diagnosis Date   COVID-19    Family history of adverse reaction to anesthesia    Mother - slow to wake   GERD (gastroesophageal reflux disease)    Hypertension     Past Surgical History:  Procedure Laterality Date   BIOPSY N/A 12/08/2020   Procedure: BIOPSY;  Surgeon: Pasty Spillers, MD;  Location: [facility];  Service: Endoscopy;  Laterality: N/A;   ESOPHAGOGASTRODUODENOSCOPY (EGD) WITH PROPOFOL N/A 12/08/2020   Procedure: ESOPHAGOGASTRODUODENOSCOPY (EGD) WITH PROPOFOL;  Surgeon: Pasty Spillers, MD;  Location: [facility];  Service: Endoscopy;  Laterality: N/A;    Allergies: Patient has no known allergies.  Medications: Prior to Admission medications   Medication Sig Start Date End Date Taking? Authorizing Provider  losartan (COZAAR) 50 MG tablet Take 50 mg by mouth daily.   Yes [provider]  omeprazole (PRILOSEC) 20 MG capsule Take 1 capsule (20 mg total) by mouth 2 (two) times daily. 12/02/20 01/01/21 Yes Pasty Spillers, MD     History reviewed. No pertinent family  history.  Social History   Socioeconomic History   Marital status: Single    Spouse name: Not on file   Number of children: Not on file   Years of education: Not on file   Highest education level: Not on file  Occupational History   Not on file  Tobacco Use   Smoking status: Former    Years: 1.00    Types: Cigarettes, E-cigarettes    Quit date: 10/10/2020    Years since quitting: 0.2   Smokeless tobacco: Never   Tobacco comments:    Smoked and vaped "off and on" for about 1 year  Vaping Use   Vaping Use: Former  Substance and Sexual Activity   Alcohol use: Not Currently   Drug use: Not Currently   Sexual activity: Not on file  Other Topics Concern   Not on file  Social History Narrative   Not on file   Social Determinants of Health   Financial Resource Strain: Not on file  Food Insecurity: Not on file  Transportation Needs: Not on file  Physical Activity: Not on file  Stress: Not on file  Social Connections: Not on file    Review of Systems: A 12 point ROS discussed and pertinent positives are indicated in the HPI above.  All other systems are negative.  Review of Systems  Vital Signs: BP 139/83   Pulse 79   Temp 98.4 F (36.9 C) (Oral)   Resp 16   Ht 6' (1.829 m)   Wt 205 lb (93 kg)   SpO2 100%   BMI 27.80 kg/m   Physical Exam Constitutional:  Appearance: Normal appearance.  HENT:     Head: Normocephalic and atraumatic.  Cardiovascular:     Rate and Rhythm: Normal rate and regular rhythm.  Pulmonary:     Effort: Pulmonary effort is normal. No respiratory distress.     Breath sounds: Normal breath sounds.  Abdominal:     General: There is no distension.     Palpations: Abdomen is soft. There is no mass.     Tenderness: There is no abdominal tenderness.  Skin:    General: Skin is warm and dry.  Neurological:     Mental Status: He is alert and oriented to person, place, and time.    Imaging: No results found.  Labs:  CBC: Recent Labs     05/02/20 1610 12/24/20 0807  WBC 9.6 5.9  HGB 16.0 15.5  HCT 47.6 43.7  PLT 272 209    COAGS: Recent Labs    12/24/20 0807  INR 1.0    BMP: Recent Labs    05/02/20 1610 09/24/20 1543  NA 140 141  K 4.0 4.7  CL 103 102  CO2 27 24  GLUCOSE 95 85  BUN 8 12  CALCIUM 9.7 9.9  CREATININE 0.68 0.89  GFRNONAA >60  --     LIVER FUNCTION TESTS: Recent Labs    05/02/20 1610 06/19/20 1524 09/24/20 1543 12/02/20 1547  BILITOT 0.8 0.4 0.5 0.5  AST 43* 50* 34 25  ALT 112* 128* 84* 60*  ALKPHOS 54 66 66 73  PROT 8.7* 7.9 7.7 7.7  ALBUMIN 4.8 5.2 4.9 5.0    Assessment and Plan: 30 year old male with no significant PMHx except GERD who has been seen by GI on 8/23 with findings of elevated ALT and elevated mitochondrial antibody.  IR received request for non target liver biopsy for further evaluation.    The patient has been NPO, no blood thinners taken, labs and vitals have been reviewed.  Risks and benefits of image guided liver biopsy was discussed with the patient and/or patient's family including, but not limited to bleeding, infection, damage to adjacent structures or low yield requiring additional tests.  All of the questions were answered and there is agreement to proceed.  Consent signed and in chart.  Thank you for this interesting consult.  I greatly enjoyed meeting Scott Vanderveer and look forward to participating in their care.  A copy of this report was sent to the requesting provider on this date.  Electronically Signed: Berneta Levins, PA-C 12/24/2020, 8:30 AM   I spent a total of 15 Minutes in face to face in clinical consultation, greater than 50% of which was counseling/coordinating care for abnormal liver labs.

## 2020-12-24 NOTE — Progress Notes (Signed)
Patient clinically stable post Liver biopsy per Dr Juliette Alcide, tolerated well. Denies complaints post procedure. Received Versed 2 mg along with Fentanyl 100 mcg IV for procedure. Report given to Weldon Picking Rn post procedure/specials.

## 2020-12-24 NOTE — Procedures (Signed)
Interventional Radiology Procedure Note  Date of Procedure: 12/24/2020  Procedure: Random liver biopsy   Findings:  1. Successful US guided random liver biopsy of the right lobe 18ga x3 passes    Complications: No immediate complications noted.   Estimated Blood Loss: minimal  Follow-up and Recommendations: 1. Bedrest 3 hours, first hour with right side down    Olive Bass, MD  Vascular & Interventional Radiology  12/24/2020 9:15 AM

## 2020-12-26 LAB — SURGICAL PATHOLOGY

## 2020-12-28 ENCOUNTER — Emergency Department
Admission: EM | Admit: 2020-12-28 | Discharge: 2020-12-28 | Disposition: A | Discharge status: Home / Self Care | Payer: 59 | Attending: Emergency Medicine | Admitting: Emergency Medicine

## 2020-12-28 ENCOUNTER — Other Ambulatory Visit: Payer: Self-pay

## 2020-12-28 ENCOUNTER — Emergency Department: Payer: 59

## 2020-12-28 ENCOUNTER — Encounter: Payer: Self-pay | Admitting: Emergency Medicine

## 2020-12-28 DIAGNOSIS — Z79899 Other long term (current) drug therapy: Secondary | ICD-10-CM | POA: Insufficient documentation

## 2020-12-28 DIAGNOSIS — I10 Essential (primary) hypertension: Secondary | ICD-10-CM | POA: Diagnosis not present

## 2020-12-28 DIAGNOSIS — Z87891 Personal history of nicotine dependence: Secondary | ICD-10-CM | POA: Diagnosis not present

## 2020-12-28 DIAGNOSIS — K219 Gastro-esophageal reflux disease without esophagitis: Secondary | ICD-10-CM | POA: Insufficient documentation

## 2020-12-28 DIAGNOSIS — K8020 Calculus of gallbladder without cholecystitis without obstruction: Secondary | ICD-10-CM | POA: Diagnosis not present

## 2020-12-28 DIAGNOSIS — Z8616 Personal history of COVID-19: Secondary | ICD-10-CM | POA: Diagnosis not present

## 2020-12-28 DIAGNOSIS — R1013 Epigastric pain: Secondary | ICD-10-CM

## 2020-12-28 LAB — CBC WITH DIFFERENTIAL/PLATELET
Abs Immature Granulocytes: 0.05 10*3/uL (ref 0.00–0.07)
Basophils Absolute: 0.1 10*3/uL (ref 0.0–0.1)
Basophils Relative: 1 %
Eosinophils Absolute: 0.1 10*3/uL (ref 0.0–0.5)
Eosinophils Relative: 1 %
HCT: 49.2 % (ref 39.0–52.0)
Hemoglobin: 16.9 g/dL (ref 13.0–17.0)
Immature Granulocytes: 0 %
Lymphocytes Relative: 19 %
Lymphs Abs: 2.2 10*3/uL (ref 0.7–4.0)
MCH: 29.5 pg (ref 26.0–34.0)
MCHC: 34.3 g/dL (ref 30.0–36.0)
MCV: 86 fL (ref 80.0–100.0)
Monocytes Absolute: 0.5 10*3/uL (ref 0.1–1.0)
Monocytes Relative: 4 %
Neutro Abs: 8.9 10*3/uL — ABNORMAL HIGH (ref 1.7–7.7)
Neutrophils Relative %: 75 %
Platelets: 241 10*3/uL (ref 150–400)
RBC: 5.72 MIL/uL (ref 4.22–5.81)
RDW: 12 % (ref 11.5–15.5)
WBC: 11.8 10*3/uL — ABNORMAL HIGH (ref 4.0–10.5)
nRBC: 0 % (ref 0.0–0.2)

## 2020-12-28 LAB — COMPREHENSIVE METABOLIC PANEL
ALT: 54 U/L — ABNORMAL HIGH (ref 0–44)
AST: 25 U/L (ref 15–41)
Albumin: 5 g/dL (ref 3.5–5.0)
Alkaline Phosphatase: 59 U/L (ref 38–126)
Anion gap: 11 (ref 5–15)
BUN: 10 mg/dL (ref 6–20)
CO2: 25 mmol/L (ref 22–32)
Calcium: 9.9 mg/dL (ref 8.9–10.3)
Chloride: 101 mmol/L (ref 98–111)
Creatinine, Ser: 0.83 mg/dL (ref 0.61–1.24)
GFR, Estimated: >60 mL/min (ref >60)
Glucose, Bld: 137 mg/dL — ABNORMAL HIGH (ref 70–99)
Potassium: 4.2 mmol/L (ref 3.5–5.1)
Sodium: 137 mmol/L (ref 135–145)
Total Bilirubin: 0.8 mg/dL (ref 0.3–1.2)
Total Protein: 8.5 g/dL — ABNORMAL HIGH (ref 6.5–8.1)

## 2020-12-28 LAB — URINALYSIS, COMPLETE (UACMP) WITH MICROSCOPIC
Bacteria, UA: NONE SEEN
Bilirubin Urine: NEGATIVE
Glucose, UA: NEGATIVE mg/dL
Hgb urine dipstick: NEGATIVE
Ketones, ur: NEGATIVE mg/dL
Leukocytes,Ua: NEGATIVE
Nitrite: NEGATIVE
Protein, ur: NEGATIVE mg/dL
Specific Gravity, Urine: 1.01 (ref 1.005–1.030)
pH: 7 (ref 5.0–8.0)

## 2020-12-28 LAB — PROTIME-INR
INR: 1 (ref 0.8–1.2)
Prothrombin Time: 12.6 seconds (ref 11.4–15.2)

## 2020-12-28 LAB — APTT: aPTT: 27 seconds (ref 24–36)

## 2020-12-28 LAB — TYPE AND SCREEN
ABO/RH(D): A POS
Antibody Screen: NEGATIVE

## 2020-12-28 LAB — LIPASE, BLOOD: Lipase: 33 U/L (ref 11–51)

## 2020-12-28 MED ORDER — IOHEXOL 350 MG/ML SOLN
100.0000 mL | Freq: Once | INTRAVENOUS | Status: AC | PRN
  Administered 2020-12-28: 100 mL via INTRAVENOUS

## 2020-12-28 MED ORDER — DICYCLOMINE HCL 10 MG PO CAPS: 10.0000 mg | ORAL_CAPSULE | Freq: Three times a day (TID) | ORAL | 0 refills | Status: DC | PRN

## 2020-12-28 NOTE — ED Notes (Signed)
Patient resting comfortably at this time. Respirations even and unlabored. Denies pain at this time. No needs expressed to RN.

## 2020-12-28 NOTE — Discharge Instructions (Signed)
Your ultrasound now shows gallstones.    Continue taking the pantoprazole twice daily.  You may take the Bentyl prescribed today if needed for more acute pain.  Follow-up both with Dr. Maximino Greenland as well as with the surgeon, Dr. Claudine Mouton.  Return to the ER immediately for new, worsening, or persistent severe abdominal pain, vomiting, fever, jaundice, weakness, or any other new or worsening symptoms that concern you.

## 2020-12-28 NOTE — ED Triage Notes (Signed)
Pt to ED via POV, states had a US guided liver biopsy on 9/14 for elevated liver enzymes and has had increasing pain since then. Pt A&O x4, noted to be jaundiced on assessment. Pt states pain started intermittently on 9/14 and has become increasingly and progressively constant since then. Pt states feels like his upper abd is swollen as well.

## 2020-12-28 NOTE — ED Provider Notes (Signed)
[facility] Emergency Department Provider Note ____________________________________________   Event Date/Time   First MD Initiated Contact with Patient 12/28/20 [phone]     (approximate)  I have reviewed the triage vital signs and the nursing notes.   HISTORY  Chief Complaint Abdominal Pain    HPI Josie Mesa is a 30 y.o. male with PMH as noted below who presents with epigastric abdominal pain for the last several months, occurring intermittently and worse at night.  The patient states that the pain has been worse than usual over the last day and is associated with nausea and vomiting.  The patient has also had diarrhea since being on pantoprazole.  He has had some subjective chills over the last day as well.  The patient reports that he had a liver biopsy several days ago.  Past Medical History:  Diagnosis Date   COVID-19    Family history of adverse reaction to anesthesia    Mother - slow to wake   GERD (gastroesophageal reflux disease)    Hypertension     Patient Active Problem List   Diagnosis Date Noted   Abdominal pain, epigastric     Past Surgical History:  Procedure Laterality Date   BIOPSY N/A 12/08/2020   Procedure: BIOPSY;  Surgeon: Pasty Spillers, MD;  Location: [facility];  Service: Endoscopy;  Laterality: N/A;   ESOPHAGOGASTRODUODENOSCOPY (EGD) WITH PROPOFOL N/A 12/08/2020   Procedure: ESOPHAGOGASTRODUODENOSCOPY (EGD) WITH PROPOFOL;  Surgeon: Pasty Spillers, MD;  Location: [facility];  Service: Endoscopy;  Laterality: N/A;    Prior to Admission medications   Medication Sig Start Date End Date Taking? Authorizing Provider  dicyclomine (BENTYL) 10 MG capsule Take 1 capsule (10 mg total) by mouth 3 (three) times daily as needed for up to 15 days for spasms (abdominal pain/cramping). 12/28/20 01/12/21 Yes Dionne Bucy, MD  losartan (COZAAR) 50 MG tablet Take 50 mg by mouth daily.    [provider]  omeprazole (PRILOSEC) 20 MG capsule Take 1 capsule (20 mg total) by mouth 2 (two) times daily. 12/02/20 01/01/21  Pasty Spillers, MD    Allergies Patient has no known allergies.  History reviewed. No pertinent family history.  Social History Social History   Tobacco Use   Smoking status: Former    Years: 1.00    Types: Cigarettes, E-cigarettes    Quit date: 10/10/2020    Years since quitting: 0.2   Smokeless tobacco: Never   Tobacco comments:    Smoked and vaped "off and on" for about 1 year  Vaping Use   Vaping Use: Former  Substance Use Topics   Alcohol use: Not Currently   Drug use: Not Currently    Review of Systems  Constitutional: Positive for chills. Eyes: No visual changes. ENT: No sore throat. Cardiovascular: Denies chest pain. Respiratory: Denies shortness of breath. Gastrointestinal: Positive for nausea and vomiting. Genitourinary: Negative for dysuria.  Musculoskeletal: Negative for back pain. Skin: Negative for rash. Neurological: Negative for headaches, focal weakness or numbness.   ____________________________________________   PHYSICAL EXAM:  VITAL SIGNS: ED Triage Vitals  Enc Vitals Group     BP 12/28/20 0338 (!) 188/114     Pulse Rate 12/28/20 0338 80     Resp 12/28/20 0338 20     Temp 12/28/20 0338 97.6 F (36.4 C)     Temp Source 12/28/20 0338 Oral     SpO2 12/28/20 0338 95 %     Weight 12/28/20 0339 205  lb (93 kg)     Height 12/28/20 0339 6' (1.829 m)     Head Circumference --      Peak Flow --      Pain Score 12/28/20 0338 10     Pain Loc --      Pain Edu? --      Excl. in GC? --     Constitutional: Alert and oriented. Well appearing and in no acute distress. Eyes: Conjunctivae are normal.  No scleral icterus. Head: Atraumatic. Nose: No congestion/rhinnorhea. Mouth/Throat: Mucous membranes are moist.   Neck: Normal range of motion.  Cardiovascular: Normal rate, regular rhythm.  Good peripheral  circulation. Respiratory: Normal respiratory effort.  No retractions.  Gastrointestinal: Soft with mild epigastric tenderness.  No distention.  Genitourinary: No flank tenderness. Musculoskeletal: No lower extremity edema.  Extremities warm and well perfused.  Neurologic:  Normal speech and language. No gross focal neurologic deficits are appreciated.  Skin:  Skin is warm and dry. No rash noted. Psychiatric: Mood and affect are normal. Speech and behavior are normal.  ____________________________________________   LABS (all labs ordered are listed, but only abnormal results are displayed)  Labs Reviewed  CBC WITH DIFFERENTIAL/PLATELET - Abnormal; Notable for the following components:      Result Value   WBC 11.8 (*)    Neutro Abs 8.9 (*)    All other components within normal limits  COMPREHENSIVE METABOLIC PANEL - Abnormal; Notable for the following components:   Glucose, Bld 137 (*)    Total Protein 8.5 (*)    ALT 54 (*)    All other components within normal limits  LIPASE, BLOOD  URINALYSIS, COMPLETE (UACMP) WITH MICROSCOPIC  PROTIME-INR  APTT  TYPE AND SCREEN   ____________________________________________  EKG   ____________________________________________  RADIOLOGY  CT abdomen/pelvis: IMPRESSION:  1. No acute findings in the abdomen or pelvis. Specifically, no  evidence for hepatic hematoma or perihepatic fluid or hemorrhage  status post biopsy 4 days ago. No intraperitoneal free fluid.  2. Hepatic steatosis.  3. Somewhat prominent enhancement in the mucosa of the gallbladder  and cystic duct, nonspecific. No gallstones seen on today's study  nor on ultrasound exam of 10/27/2020.   US abdomen RUQ:  IMPRESSION:  1. Cholelithiasis without cholecystitis.  2. Hepatic steatosis.       ____________________________________________   PROCEDURES  Procedure(s) performed: No  Procedures  Critical Care performed:  No ____________________________________________   INITIAL IMPRESSION / ASSESSMENT AND PLAN / ED COURSE  Pertinent labs & imaging results that were available during my care of the patient were reviewed by me and considered in my medical decision making (see chart for details).   30 year old male with PMH as noted above presents with persistent epigastric pain which he has had for several months and notes that it is worse over the last day and associated with some nausea and vomiting.  He had a liver biopsy several days ago.  I reviewed the past medical records in Epic.  The patient has been followed by GI for this epigastric pain for the last several months and was noted to have elevated liver enzymes and positive antimitochondrial antibody.  He had an ultrasound-guided biopsy of the liver on 9/14 which was uncomplicated.  He had an ultrasound in July showing fatty liver but no gallstones or other acute abnormality.  On exam currently the patient is well-appearing.  His vital signs are normal.  The abdomen is soft with mild epigastric tenderness.  He was noted to  be possibly jaundiced by the triage nurse, however I do not see any evidence of jaundice or scleral icterus.  Lab work-up obtained from triage is all within normal limits except for minimally elevated WBC count.  Bilirubin is not elevated; ALT is only minimally elevated.  CT abdomen shows prominent enhancement in the mucosa of the gallbladder and cystic duct but no other acute abnormalities.  There is no evidence of any complication related to the recent biopsy.  Overall the patient's presentation is consistent with ongoing chronic epigastric pain, possible gastritis versus medullary cause.  Given the CT findings we will obtain a right upper quadrant ultrasound to rule out any acute process.  I anticipate discharge home with GI follow-up.  ----------------------------------------- 10:45 AM on  12/28/2020 -----------------------------------------  Right upper quadrant showed cholelithiasis without evidence for cholecystitis.  I discussed the case with Dr. Maximino Greenland from GI who initially recommended a HIDA scan.  However I was called by the nuclear medicine department and advised that HIDA is only available on the weekend for truly emergent indication such as possible eminent surgery.  At this time, clinically, the patient does not have evidence of acute cholecystitis.  He has only minimal leukocytosis with otherwise reassuring labs, no concerning ultrasound findings, no focal right upper quadrant tenderness, and no active pain at this time.    I discussed again with Dr. Maximino Greenland who agrees with discharge and asked that I ensure that the patient also has follow-up arranged with surgery.  I then called Dr. Claudine Mouton from general surgery and discussed the case with him.  He agrees to follow-up with the patient.  On reassessment, the patient continues to appear comfortable with no active pain.  He is stable for discharge home.  Return precautions given, and he expresses understanding.   ____________________________________________   FINAL CLINICAL IMPRESSION(S) / ED DIAGNOSES  Final diagnoses:  Epigastric abdominal pain  Calculus of gallbladder without cholecystitis without obstruction      NEW MEDICATIONS STARTED DURING THIS VISIT:  New Prescriptions   DICYCLOMINE (BENTYL) 10 MG CAPSULE    Take 1 capsule (10 mg total) by mouth 3 (three) times daily as needed for up to 15 days for spasms (abdominal pain/cramping).     Note:  This document was prepared using Dragon voice recognition software and may include unintentional dictation errors.    Dionne Bucy, MD 12/28/20 1048

## 2020-12-29 ENCOUNTER — Other Ambulatory Visit: Payer: Self-pay | Admitting: Gastroenterology

## 2020-12-29 ENCOUNTER — Encounter: Payer: Self-pay | Admitting: Gastroenterology

## 2020-12-29 ENCOUNTER — Ambulatory Visit: Payer: 59 | Admitting: Gastroenterology

## 2020-12-29 DIAGNOSIS — R748 Abnormal levels of other serum enzymes: Secondary | ICD-10-CM

## 2021-01-01 ENCOUNTER — Encounter: Payer: Self-pay | Admitting: Surgery

## 2021-01-01 ENCOUNTER — Other Ambulatory Visit: Payer: Self-pay

## 2021-01-01 ENCOUNTER — Ambulatory Visit (INDEPENDENT_AMBULATORY_CARE_PROVIDER_SITE_OTHER): Payer: 59 | Admitting: Surgery

## 2021-01-01 ENCOUNTER — Ambulatory Visit: Payer: Self-pay | Admitting: Surgery

## 2021-01-01 VITALS — BP 128/84 | HR 77 | Temp 98.3°F | Ht 72.0 in | Wt 205.0 lb

## 2021-01-01 DIAGNOSIS — K8010 Calculus of gallbladder with chronic cholecystitis without obstruction: Secondary | ICD-10-CM | POA: Diagnosis not present

## 2021-01-01 NOTE — H&P (View-Only) (Signed)
Patient ID: Tristan Salinas, male   DOB: 1990/09/01, 30 y.o.   MRN: [id number]  Chief Complaint: Epigastric pain x1 year  History of Present Illness Osama Coleson is a 30 y.o. male with central/epigastric abdominal pain nonradiating over the last year, associated nausea and vomiting.  History of loose stools.  1-2 times per month happening at night.  Associated GERD/pyrosis.  In April begin treatment for gastroesophageal reflux without benefit.  He had a negative EGD at that time.  He had elevated LFTs with an ultrasound in July that did not report gallstones.  Therefore he underwent a hepatic biopsy which was also negative.  Repeat recent limited abdominal ultrasound reveals gallstones.  Past Medical History Past Medical History:  Diagnosis Date   COVID-19    Family history of adverse reaction to anesthesia    Mother - slow to wake   GERD (gastroesophageal reflux disease)    Hypertension       Past Surgical History:  Procedure Laterality Date   BIOPSY N/A 12/08/2020   Procedure: BIOPSY;  Surgeon: Pasty Spillers, MD;  Location: [facility];  Service: Endoscopy;  Laterality: N/A;   ESOPHAGOGASTRODUODENOSCOPY (EGD) WITH PROPOFOL N/A 12/08/2020   Procedure: ESOPHAGOGASTRODUODENOSCOPY (EGD) WITH PROPOFOL;  Surgeon: Pasty Spillers, MD;  Location: [facility];  Service: Endoscopy;  Laterality: N/A;    No Known Allergies  Current Outpatient Medications  Medication Sig Dispense Refill   dicyclomine (BENTYL) 10 MG capsule Take 1 capsule (10 mg total) by mouth 3 (three) times daily as needed for up to 15 days for spasms (abdominal pain/cramping). 20 capsule 0   losartan (COZAAR) 50 MG tablet Take 50 mg by mouth daily.     omeprazole (PRILOSEC) 20 MG capsule TAKE 1 CAPSULE BY MOUTH TWICE A DAY 60 capsule 0   No current facility-administered medications for this visit.    Family History Family History  Problem Relation Age of Onset   Diabetes Mother     Diabetes Maternal Grandmother       Social History Social History   Tobacco Use   Smoking status: Former    Years: 1.00    Types: Cigarettes, E-cigarettes    Quit date: 10/10/2020    Years since quitting: 0.2   Smokeless tobacco: Never   Tobacco comments:    Smoked and vaped "off and on" for about 1 year  Vaping Use   Vaping Use: Former  Substance Use Topics   Alcohol use: Not Currently   Drug use: Not Currently        Review of Systems  Constitutional:  Positive for diaphoresis, fever and malaise/fatigue.  HENT: Negative.    Eyes: Negative.   Respiratory:  Positive for shortness of breath.   Cardiovascular:  Positive for chest pain and orthopnea.  Gastrointestinal:  Positive for abdominal pain, diarrhea, heartburn, nausea and vomiting.  Genitourinary: Negative.   Skin:  Positive for itching.  Neurological:  Positive for dizziness and headaches.  Psychiatric/Behavioral: Negative.       Physical Exam Blood pressure 128/84, pulse 77, temperature 98.3 F (36.8 C), height 6' (1.829 m), weight 205 lb (93 kg), SpO2 98 %. Last Weight  Most recent update: 01/01/2021  4:04 PM    Weight  93 kg (205 lb)             CONSTITUTIONAL: Well developed, and nourished, appropriately responsive and aware without distress.   EYES: Sclera non-icteric.   EARS, NOSE, MOUTH AND THROAT: Mask worn.  Hearing is intact to voice.  NECK: Trachea is midline, and there is no jugular venous distension.  LYMPH NODES:  Lymph nodes in the neck are not enlarged. RESPIRATORY:  Lungs are clear, and breath sounds are equal bilaterally. Normal respiratory effort without pathologic use of accessory muscles. CARDIOVASCULAR: Heart is regular in rate and rhythm. GI: The abdomen is soft, nontender, and nondistended. There were no palpable masses. I did not appreciate hepatosplenomegaly. There were normal bowel sounds. MUSCULOSKELETAL:  Symmetrical muscle tone appreciated in all four extremities.     SKIN: Skin turgor is normal. No pathologic skin lesions appreciated.  NEUROLOGIC:  Motor and sensation appear grossly normal.  Cranial nerves are grossly without defect. PSYCH:  Alert and oriented to person, place and time. Affect is appropriate for situation.  Data Reviewed I have personally reviewed what is currently available of the patient's imaging, recent labs and medical records.   Labs:  CBC Latest Ref Rng & Units 12/28/2020 12/24/2020 05/02/2020  WBC 4.0 - 10.5 K/uL 11.8(H) 5.9 9.6  Hemoglobin 13.0 - 17.0 g/dL 73.2 20.2 54.2  Hematocrit 39.0 - 52.0 % 49.2 43.7 47.6  Platelets 150 - 400 K/uL 241 209 272   CMP Latest Ref Rng & Units 12/28/2020 12/02/2020 09/24/2020  Glucose 70 - 99 mg/dL 706(C) - 85  BUN 6 - 20 mg/dL 10 - 12  Creatinine 3.76 - 1.24 mg/dL 2.83 - 1.51  Sodium 761 - 145 mmol/L 137 - 141  Potassium 3.5 - 5.1 mmol/L 4.2 - 4.7  Chloride 98 - 111 mmol/L 101 - 102  CO2 22 - 32 mmol/L 25 - 24  Calcium 8.9 - 10.3 mg/dL 9.9 - 9.9  Total Protein 6.5 - 8.1 g/dL 6.0(V) 7.7 7.7  Total Bilirubin 0.3 - 1.2 mg/dL 0.8 0.5 0.5  Alkaline Phos 38 - 126 U/L 59 73 66  AST 15 - 41 U/L 25 25 34  ALT 0 - 44 U/L 54(H) 60(H) 84(H)    Imaging: Radiology review:  CLINICAL DATA:  Epigastric abdominal pain   EXAM: ULTRASOUND ABDOMEN LIMITED RIGHT UPPER QUADRANT   COMPARISON:  10/27/2020 and preceding abdominal CT the same day.   FINDINGS: Gallbladder:   Shadowing gallstones. No gallbladder wall thickening or over distension. No pericholecystic edema.   Common bile duct:   Diameter: 2 mm   Liver:   Diffusely increased liver echogenicity. No focal mass lesion. Portal vein is patent on color Doppler imaging with normal direction of blood flow towards the liver.   IMPRESSION: 1. Cholelithiasis without cholecystitis. 2. Hepatic steatosis.     Electronically Signed   By: Tiburcio Pea M.D.   On: 12/28/2020 08:21 Within last 24 hrs: No results found. CLINICAL DATA:   Worsening pain after ultrasound-guided liver biopsy 12/24/2020.   EXAM: CT ABDOMEN AND PELVIS WITH CONTRAST   TECHNIQUE: Multidetector CT imaging of the abdomen and pelvis was performed using the standard protocol following bolus administration of intravenous contrast.   CONTRAST:  OMNIPAQUE IOHEXOL 350 MG/ML SOLN   COMPARISON:  None.   FINDINGS: Lower chest: Unremarkable   Hepatobiliary: Fatty liver with subcapsular sparing noted along the gallbladder fossa. No focal abnormality in the liver parenchyma. Specifically, no intraparenchymal hematoma. No perihepatic fluid or hemorrhage. Gallbladder is nondistended and there may be some trace pericholecystic fluid. Somewhat prominent enhancement noted in the mucosa of the gallbladder and cystic duct, nonspecific. No intra or extrahepatic biliary duct dilatation.   Pancreas: No focal mass lesion. No dilatation of the main duct.  No intraparenchymal cyst. No peripancreatic edema.   Spleen: No splenomegaly. No focal mass lesion.   Adrenals/Urinary Tract: No adrenal nodule or mass. Kidneys unremarkable. No evidence for hydroureter. The urinary bladder appears normal for the degree of distention.   Stomach/Bowel: Stomach is unremarkable. No gastric wall thickening. No evidence of outlet obstruction. Duodenum is normally positioned as is the ligament of Treitz. No small bowel wall thickening. No small bowel dilatation. The terminal ileum is normal. The appendix is normal. No gross colonic mass. No colonic wall thickening.   Vascular/Lymphatic: No abdominal aortic aneurysm. There is no gastrohepatic or hepatoduodenal ligament lymphadenopathy. No retroperitoneal or mesenteric lymphadenopathy. No pelvic sidewall lymphadenopathy.   Reproductive: The prostate gland and seminal vesicles are unremarkable.   Other: No intraperitoneal free fluid.   Musculoskeletal: No worrisome lytic or sclerotic osseous abnormality.    IMPRESSION: 1. No acute findings in the abdomen or pelvis. Specifically, no evidence for hepatic hematoma or perihepatic fluid or hemorrhage status post biopsy 4 days ago. No intraperitoneal free fluid. 2. Hepatic steatosis. 3. Somewhat prominent enhancement in the mucosa of the gallbladder and cystic duct, nonspecific. No gallstones seen on today's study nor on ultrasound exam of 10/27/2020.     Electronically Signed   By: Kennith Center M.D.   On: 12/28/2020 05:17 Assessment    Chronic calculus cholecystitis Patient Active Problem List   Diagnosis Date Noted   Abdominal pain, epigastric     Plan    Robotic cholecystectomy with ICG biliary tract imaging  This was discussed thoroughly.  Optimal plan is for robotic cholecystectomy.  Risks and benefits have been discussed with the patient which include but are not limited to anesthesia, bleeding, infection, biliary ductal injury or stenosis, other associated unanticipated injuries affiliated with laparoscopic surgery.  I believe there is the desire to proceed, interpreter utilized as needed.  Questions elicited and answered to satisfaction.  No guarantees ever expressed or implied.  Face-to-face time spent with the patient and accompanying care providers(if present) was 25 minutes, with more than 50% of the time spent counseling, educating, and coordinating care of the patient.    These notes generated with voice recognition software. I apologize for typographical errors.  Campbell Lerner M.D., FACS 01/01/2021, 4:40 PM

## 2021-01-01 NOTE — Patient Instructions (Signed)
You have requested to have your gallbladder removed. This will be done at  [facility] with Dr. Rodenberg.  You will most likely be out of work 1-2 weeks for this surgery. You will return after your post-op appointment with a lifting restriction for approximately 4 more weeks.  You will be able to eat anything you would like to following surgery. But, start by eating a bland diet and advance this as tolerated. The Gallbladder diet is below, please go as closely by this diet as possible prior to surgery to avoid any further attacks.  Please see the (blue)pre-care form that you have been given today. Our surgery scheduler will call you to look at surgery dates and to go over information.  If you have any questions, please call our office.  Laparoscopic Cholecystectomy Laparoscopic cholecystectomy is surgery to remove the gallbladder. The gallbladder is located in the upper right part of the abdomen, behind the liver. It is a storage sac for bile, which is produced in the liver. Bile aids in the digestion and absorption of fats. Cholecystectomy is often done for inflammation of the gallbladder (cholecystitis). This condition is usually caused by a buildup of gallstones (cholelithiasis) in the gallbladder. Gallstones can block the flow of bile, and that can result in inflammation and pain. In severe cases, emergency surgery may be required. If emergency surgery is not required, you will have time to prepare for the procedure. Laparoscopic surgery is an alternative to open surgery. Laparoscopic surgery has a shorter recovery time. Your common bile duct may also need to be examined during the procedure. If stones are found in the common bile duct, they may be removed. LET YOUR HEALTH CARE PROVIDER KNOW ABOUT:  Any allergies you have.  All medicines you are taking, including vitamins, herbs, eye drops, creams, and over-the-counter medicines.  Previous problems you or members of your family have had  with the use of anesthetics.  Any blood disorders you have.  Previous surgeries you have had.    Any medical conditions you have. RISKS AND COMPLICATIONS Generally, this is a safe procedure. However, problems may occur, including:  Infection.  Bleeding.  Allergic reactions to medicines.  Damage to other structures or organs.  A stone remaining in the common bile duct.  A bile leak from the cyst duct that is clipped when your gallbladder is removed.  The need to convert to open surgery, which requires a larger incision in the abdomen. This may be necessary if your surgeon thinks that it is not safe to continue with a laparoscopic procedure. BEFORE THE PROCEDURE  Ask your health care provider about:  Changing or stopping your regular medicines. This is especially important if you are taking diabetes medicines or blood thinners.  Taking medicines such as aspirin and ibuprofen. These medicines can thin your blood. Do not take these medicines before your procedure if your health care provider instructs you not to.  Follow instructions from your health care provider about eating or drinking restrictions.  Let your health care provider know if you develop a cold or an infection before surgery.  Plan to have someone take you home after the procedure.  Ask your health care provider how your surgical site will be marked or identified.  You may be given antibiotic medicine to help prevent infection. PROCEDURE  To reduce your risk of infection:  Your health care team will wash or sanitize their hands.  Your skin will be washed with soap.  An IV tube may be   in your mouth. The surgeon will make several small cuts (incisions) in your abdomen. A thin, lighted tube (laparoscope) that has a tiny camera on the end will be inserted through one of the small incisions. The camera on the  laparoscope will send a picture to a TV screen (monitor) in the operating room. This will give the surgeon a good view inside your abdomen. A gas will be pumped into your abdomen. This will expand your abdomen to give the surgeon more room to perform the surgery. Other tools that are needed for the procedure will be inserted through the other incisions. The gallbladder will be removed through one of the incisions. After your gallbladder has been removed, the incisions will be closed with stitches (sutures), staples, or skin glue. Your incisions may be covered with a bandage (dressing). The procedure may vary among health care providers and hospitals. AFTER THE PROCEDURE Your blood pressure, heart rate, breathing rate, and blood oxygen level will be monitored often until the medicines you were given have worn off. You will be given medicines as needed to control your pain.   This information is not intended to replace advice given to you by your health care provider. Make sure you discuss any questions you have with your health care provider.   Document Released: 03/29/2005 Document Revised: 12/18/2014 Document Reviewed: 11/08/2012 Elsevier Interactive Patient Education 2016 [employer]   Low-Fat Diet for Gallbladder Conditions A low-fat diet can be helpful if you have pancreatitis or a gallbladder condition. With these conditions, your pancreas and gallbladder have trouble digesting fats. A healthy eating plan with less fat will help rest your pancreas and gallbladder and reduce your symptoms. WHAT DO I NEED TO KNOW ABOUT THIS DIET? Eat a low-fat diet. Reduce your fat intake to less than 20-30% of your total daily calories. This is less than 50-60 g of fat per day. Remember that you need some fat in your diet. Ask your dietician what your daily goal should be. Choose nonfat and low-fat healthy foods. Look for the words "nonfat," "low fat," or "fat free." As a guide, look on the label and  choose foods with less than 3 g of fat per serving. Eat only one serving. Avoid alcohol. Do not smoke. If you need help quitting, talk with your health care provider. Eat small frequent meals instead of three large heavy meals. WHAT FOODS CAN I EAT? Grains Include healthy grains and starches such as potatoes, wheat bread, fiber-rich cereal, and brown rice. Choose whole grain options whenever possible. In adults, whole grains should account for 45-65% of your daily calories.  Fruits and Vegetables Eat plenty of fruits and vegetables. Fresh fruits and vegetables add fiber to your diet. Meats and Other Protein Sources Eat lean meat such as chicken and pork. Trim any fat off of meat before cooking it. Eggs, fish, and beans are other sources of protein. In adults, these foods should account for 10-35% of your daily calories. Dairy Choose low-fat milk and dairy options. Dairy includes fat and protein, as well as calcium.  Fats and Oils Limit high-fat foods such as fried foods, sweets, baked goods, sugary drinks.  Other Creamy sauces and condiments, such as mayonnaise, can add extra fat. Think about whether or not you need to use them, or use smaller amounts or low fat options. WHAT FOODS ARE NOT RECOMMENDED? High fat foods, such as: Baked goods. Ice cream. French toast. Sweet rolls. Pizza. Cheese bread. Foods covered with batter, butter, creamy sauces,   or cheese. Fried foods. Sugary drinks and desserts. Foods that cause gas or bloating   This information is not intended to replace advice given to you by your health care provider. Make sure you discuss any questions you have with your health care provider.   Document Released: 04/03/2013 Document Reviewed: 04/03/2013 Elsevier Interactive Patient Education [employer].

## 2021-01-01 NOTE — Progress Notes (Signed)
Patient ID: Tristan Salinas, male   DOB: 1990/09/01, 30 y.o.   MRN: [id number]  Chief Complaint: Epigastric pain x1 year  History of Present Illness Tristan Salinas is a 30 y.o. male with central/epigastric abdominal pain nonradiating over the last year, associated nausea and vomiting.  History of loose stools.  1-2 times per month happening at night.  Associated GERD/pyrosis.  In April begin treatment for gastroesophageal reflux without benefit.  He had a negative EGD at that time.  He had elevated LFTs with an ultrasound in July that did not report gallstones.  Therefore he underwent a hepatic biopsy which was also negative.  Repeat recent limited abdominal ultrasound reveals gallstones.  Past Medical History Past Medical History:  Diagnosis Date   COVID-19    Family history of adverse reaction to anesthesia    Mother - slow to wake   GERD (gastroesophageal reflux disease)    Hypertension       Past Surgical History:  Procedure Laterality Date   BIOPSY N/A 12/08/2020   Procedure: BIOPSY;  Surgeon: Pasty Spillers, MD;  Location: [facility];  Service: Endoscopy;  Laterality: N/A;   ESOPHAGOGASTRODUODENOSCOPY (EGD) WITH PROPOFOL N/A 12/08/2020   Procedure: ESOPHAGOGASTRODUODENOSCOPY (EGD) WITH PROPOFOL;  Surgeon: Pasty Spillers, MD;  Location: [facility];  Service: Endoscopy;  Laterality: N/A;    No Known Allergies  Current Outpatient Medications  Medication Sig Dispense Refill   dicyclomine (BENTYL) 10 MG capsule Take 1 capsule (10 mg total) by mouth 3 (three) times daily as needed for up to 15 days for spasms (abdominal pain/cramping). 20 capsule 0   losartan (COZAAR) 50 MG tablet Take 50 mg by mouth daily.     omeprazole (PRILOSEC) 20 MG capsule TAKE 1 CAPSULE BY MOUTH TWICE A DAY 60 capsule 0   No current facility-administered medications for this visit.    Family History Family History  Problem Relation Age of Onset   Diabetes Mother     Diabetes Maternal Grandmother       Social History Social History   Tobacco Use   Smoking status: Former    Years: 1.00    Types: Cigarettes, E-cigarettes    Quit date: 10/10/2020    Years since quitting: 0.2   Smokeless tobacco: Never   Tobacco comments:    Smoked and vaped "off and on" for about 1 year  Vaping Use   Vaping Use: Former  Substance Use Topics   Alcohol use: Not Currently   Drug use: Not Currently        Review of Systems  Constitutional:  Positive for diaphoresis, fever and malaise/fatigue.  HENT: Negative.    Eyes: Negative.   Respiratory:  Positive for shortness of breath.   Cardiovascular:  Positive for chest pain and orthopnea.  Gastrointestinal:  Positive for abdominal pain, diarrhea, heartburn, nausea and vomiting.  Genitourinary: Negative.   Skin:  Positive for itching.  Neurological:  Positive for dizziness and headaches.  Psychiatric/Behavioral: Negative.       Physical Exam Blood pressure 128/84, pulse 77, temperature 98.3 F (36.8 C), height 6' (1.829 m), weight 205 lb (93 kg), SpO2 98 %. Last Weight  Most recent update: 01/01/2021  4:04 PM    Weight  93 kg (205 lb)             CONSTITUTIONAL: Well developed, and nourished, appropriately responsive and aware without distress.   EYES: Sclera non-icteric.   EARS, NOSE, MOUTH AND THROAT: Mask worn.  Hearing is intact to voice.  NECK: Trachea is midline, and there is no jugular venous distension.  LYMPH NODES:  Lymph nodes in the neck are not enlarged. RESPIRATORY:  Lungs are clear, and breath sounds are equal bilaterally. Normal respiratory effort without pathologic use of accessory muscles. CARDIOVASCULAR: Heart is regular in rate and rhythm. GI: The abdomen is soft, nontender, and nondistended. There were no palpable masses. I did not appreciate hepatosplenomegaly. There were normal bowel sounds. MUSCULOSKELETAL:  Symmetrical muscle tone appreciated in all four extremities.     SKIN: Skin turgor is normal. No pathologic skin lesions appreciated.  NEUROLOGIC:  Motor and sensation appear grossly normal.  Cranial nerves are grossly without defect. PSYCH:  Alert and oriented to person, place and time. Affect is appropriate for situation.  Data Reviewed I have personally reviewed what is currently available of the patient's imaging, recent labs and medical records.   Labs:  CBC Latest Ref Rng & Units 12/28/2020 12/24/2020 05/02/2020  WBC 4.0 - 10.5 K/uL 11.8(H) 5.9 9.6  Hemoglobin 13.0 - 17.0 g/dL 16.9 15.5 16.0  Hematocrit 39.0 - 52.0 % 49.2 43.7 47.6  Platelets 150 - 400 K/uL 241 209 272   CMP Latest Ref Rng & Units 12/28/2020 12/02/2020 09/24/2020  Glucose 70 - 99 mg/dL 137(H) - 85  BUN 6 - 20 mg/dL 10 - 12  Creatinine 0.61 - 1.24 mg/dL 0.83 - 0.89  Sodium 135 - 145 mmol/L 137 - 141  Potassium 3.5 - 5.1 mmol/L 4.2 - 4.7  Chloride 98 - 111 mmol/L 101 - 102  CO2 22 - 32 mmol/L 25 - 24  Calcium 8.9 - 10.3 mg/dL 9.9 - 9.9  Total Protein 6.5 - 8.1 g/dL 8.5(H) 7.7 7.7  Total Bilirubin 0.3 - 1.2 mg/dL 0.8 0.5 0.5  Alkaline Phos 38 - 126 U/L 59 73 66  AST 15 - 41 U/L 25 25 34  ALT 0 - 44 U/L 54(H) 60(H) 84(H)    Imaging: Radiology review:  CLINICAL DATA:  Epigastric abdominal pain   EXAM: ULTRASOUND ABDOMEN LIMITED RIGHT UPPER QUADRANT   COMPARISON:  10/27/2020 and preceding abdominal CT the same day.   FINDINGS: Gallbladder:   Shadowing gallstones. No gallbladder wall thickening or over distension. No pericholecystic edema.   Common bile duct:   Diameter: 2 mm   Liver:   Diffusely increased liver echogenicity. No focal mass lesion. Portal vein is patent on color Doppler imaging with normal direction of blood flow towards the liver.   IMPRESSION: 1. Cholelithiasis without cholecystitis. 2. Hepatic steatosis.     Electronically Signed   By: Jonathan  Watts M.D.   On: 12/28/2020 08:21 Within last 24 hrs: No results found. CLINICAL DATA:   Worsening pain after ultrasound-guided liver biopsy 12/24/2020.   EXAM: CT ABDOMEN AND PELVIS WITH CONTRAST   TECHNIQUE: Multidetector CT imaging of the abdomen and pelvis was performed using the standard protocol following bolus administration of intravenous contrast.   CONTRAST:  100mL OMNIPAQUE IOHEXOL 350 MG/ML SOLN   COMPARISON:  None.   FINDINGS: Lower chest: Unremarkable   Hepatobiliary: Fatty liver with subcapsular sparing noted along the gallbladder fossa. No focal abnormality in the liver parenchyma. Specifically, no intraparenchymal hematoma. No perihepatic fluid or hemorrhage. Gallbladder is nondistended and there may be some trace pericholecystic fluid. Somewhat prominent enhancement noted in the mucosa of the gallbladder and cystic duct, nonspecific. No intra or extrahepatic biliary duct dilatation.   Pancreas: No focal mass lesion. No dilatation of the main duct.   No intraparenchymal cyst. No peripancreatic edema.   Spleen: No splenomegaly. No focal mass lesion.   Adrenals/Urinary Tract: No adrenal nodule or mass. Kidneys unremarkable. No evidence for hydroureter. The urinary bladder appears normal for the degree of distention.   Stomach/Bowel: Stomach is unremarkable. No gastric wall thickening. No evidence of outlet obstruction. Duodenum is normally positioned as is the ligament of Treitz. No small bowel wall thickening. No small bowel dilatation. The terminal ileum is normal. The appendix is normal. No gross colonic mass. No colonic wall thickening.   Vascular/Lymphatic: No abdominal aortic aneurysm. There is no gastrohepatic or hepatoduodenal ligament lymphadenopathy. No retroperitoneal or mesenteric lymphadenopathy. No pelvic sidewall lymphadenopathy.   Reproductive: The prostate gland and seminal vesicles are unremarkable.   Other: No intraperitoneal free fluid.   Musculoskeletal: No worrisome lytic or sclerotic osseous abnormality.    IMPRESSION: 1. No acute findings in the abdomen or pelvis. Specifically, no evidence for hepatic hematoma or perihepatic fluid or hemorrhage status post biopsy 4 days ago. No intraperitoneal free fluid. 2. Hepatic steatosis. 3. Somewhat prominent enhancement in the mucosa of the gallbladder and cystic duct, nonspecific. No gallstones seen on today's study nor on ultrasound exam of 10/27/2020.     Electronically Signed   By: Eric  Mansell M.D.   On: 12/28/2020 05:17 Assessment    Chronic calculus cholecystitis Patient Active Problem List   Diagnosis Date Noted   Abdominal pain, epigastric     Plan    Robotic cholecystectomy with ICG biliary tract imaging  This was discussed thoroughly.  Optimal plan is for robotic cholecystectomy.  Risks and benefits have been discussed with the patient which include but are not limited to anesthesia, bleeding, infection, biliary ductal injury or stenosis, other associated unanticipated injuries affiliated with laparoscopic surgery.  I believe there is the desire to proceed, interpreter utilized as needed.  Questions elicited and answered to satisfaction.  No guarantees ever expressed or implied.  Face-to-face time spent with the patient and accompanying care providers(if present) was 25 minutes, with more than 50% of the time spent counseling, educating, and coordinating care of the patient.    These notes generated with voice recognition software. I apologize for typographical errors.  Ester Mabe M.D., FACS 01/01/2021, 4:40 PM     

## 2021-01-02 ENCOUNTER — Telehealth: Payer: Self-pay | Admitting: Surgery

## 2021-01-02 NOTE — Telephone Encounter (Signed)
Outgoing call is made, left message for patient to call.  Please inform patient of Pre-Admission date/time, COVID Testing date and Surgery date.  Surgery Date: 01/12/21 Preadmission Testing Date: 01/07/21 (phone 8a-1p) Covid Testing Date: Not needed.     Also patient will need to call at [phone], between 1-3:00pm the day before surgery, to find out what time to arrive for surgery.

## 2021-01-02 NOTE — Telephone Encounter (Signed)
Patient returns call, he is now aware of all dates regarding his surgery and information given, patient verbalized understanding.

## 2021-01-05 ENCOUNTER — Ambulatory Visit: Payer: Self-pay | Admitting: Surgery

## 2021-01-07 ENCOUNTER — Encounter
Admission: RE | Admit: 2021-01-07 | Discharge: 2021-01-07 | Disposition: A | Discharge status: Home / Self Care | Payer: 59 | Source: Ambulatory Visit | Attending: Surgery | Admitting: Surgery

## 2021-01-07 ENCOUNTER — Other Ambulatory Visit: Payer: Self-pay

## 2021-01-07 NOTE — Patient Instructions (Addendum)
Your procedure is scheduled on: 01/12/2021  Report to the Registration Desk on the 1st floor of the [facility]. To find out your arrival time, please call [phone] between 1PM - 3PM on: 01/09/2021  REMEMBER: Instructions that are not followed completely may result in serious medical risk, up to and including death; or upon the discretion of your surgeon and anesthesiologist your surgery may need to be rescheduled.  Do not eat food after midnight the night before surgery.  No gum chewing, lozengers or hard candies.  You may however, drink CLEAR liquids up to 2 hours before you are scheduled to arrive for your surgery. Do not drink anything within 2 hours of your scheduled arrival time.  Clear liquids include: - water  - apple juice without pulp - gatorade (not RED, PURPLE, OR BLUE) - black coffee or tea (Do NOT add milk or creamers to the coffee or tea) Do NOT drink anything that is not on this list.   In addition, your doctor has ordered for you to drink the provided  Ensure Pre-Surgery Clear Carbohydrate Drink -provided for you  Drinking this carbohydrate drink up to two hours before surgery helps to reduce insulin resistance and improve patient outcomes. Please complete drinking 2 hours prior to scheduled arrival time.  OMEPRAZOLE (PRilosec) (TAKE THESE MEDICATIONS THE MORNING OF SURGERY WITH A SIP OF WATER).    One week prior to surgery: Stop Anti-inflammatories (NSAIDS) such as Advil, Aleve, Ibuprofen, Motrin, Naproxen, Naprosyn and Aspirin based products such as Excedrin, Goodys Powder, BC Powder. Stop ANY OVER THE COUNTER supplements until after surgery.   No Alcohol for 24 hours before or after surgery.  No Smoking including e-cigarettes for 24 hours prior to surgery.  No chewable tobacco products for at least 6 hours prior to surgery.  No nicotine patches on the day of surgery.  Do not use any "recreational" drugs for at least a week prior to your surgery.   Please be advised that the combination of cocaine and anesthesia may have negative outcomes, up to and including death. If you test positive for cocaine, your surgery will be cancelled.  On the morning of surgery brush your teeth with toothpaste and water, you may rinse your mouth with mouthwash if you wish. Do not swallow any toothpaste or mouthwash.  Use CHG Soap or wipes as directed on instruction sheet.-  Do not wear jewelry.  Do not wear lotions, powders, or perfumes.   Do not shave body from the neck down 48 hours prior to surgery just in case you cut yourself which could leave a site for infection.  Also, freshly shaved skin may become irritated if using the CHG soap.  Contact lenses, hearing aids and dentures may not be worn into surgery.  Do not bring valuables to the hospital. [facility] is not responsible for any missing/lost belongings or valuables.    Notify your doctor if there is any change in your medical condition (cold, fever, infection).  Wear comfortable clothing (specific to your surgery type) to the hospital.  After surgery, you can help prevent lung complications by doing breathing exercises.  Take deep breaths and cough every 1-2 hours. Your doctor may order a device called an Incentive Spirometer to help you take deep breaths.  When coughing or sneezing, hold a pillow firmly against your incision with both hands. This is called "splinting." Doing this helps protect your incision. It also decreases belly discomfort.  If you are being admitted to the hospital  overnight, leave your suitcase in the car. After surgery it may be brought to your room.  If you are being discharged the day of surgery, you will not be allowed to drive home. You will need a responsible adult (18 years or older) to drive you home and stay with you that night.   If you are taking public transportation, you will need to have a responsible adult (18 years or older) with you. Please  confirm with your physician that it is acceptable to use public transportation.   Please call the Pre-admissions Testing Dept. at [phone] if you have any questions about these instructions.  Surgery Visitation Policy:  Patients undergoing a surgery or procedure may have one family member or support person with them as long as that person is not COVID-19 positive or experiencing its symptoms.  That person may remain in the waiting area during the procedure and may rotate out with other people.  Inpatient Visitation:    Visiting hours are 7 a.m. to 8 p.m. Up to two visitors ages 16+ are allowed at one time in a patient room. The visitors may rotate out with other people during the day. Visitors must check out when they leave, or other visitors will not be allowed. One designated support person may remain overnight. The visitor must pass COVID-19 screenings, use hand sanitizer when entering and exiting the patient's room and wear a mask at all times, including in the patient's room. Patients must also wear a mask when staff or their visitor are in the room. Masking is required regardless of vaccination status.

## 2021-01-07 NOTE — Patient Instructions (Signed)
Your procedure is scheduled on: 01/12/2021  Report to the Registration Desk on the 1st floor of the [facility]. To find out your arrival time, please call [phone] between 1PM - 3PM on: 01/09/2021  REMEMBER: Instructions that are not followed completely may result in serious medical risk, up to and including death; or upon the discretion of your surgeon and anesthesiologist your surgery may need to be rescheduled.  Do not eat food after midnight the night before surgery.  No gum chewing, lozengers or hard candies.  You may however, drink CLEAR liquids up to 2 hours before you are scheduled to arrive for your surgery. Do not drink anything within 2 hours of your scheduled arrival time.  Clear liquids include: - water  - apple juice without pulp - gatorade (not RED, PURPLE, OR BLUE) - black coffee or tea (Do NOT add milk or creamers to the coffee or tea) Do NOT drink anything that is not on this list.   In addition, your doctor has ordered for you to drink the provided  Ensure Pre-Surgery Clear Carbohydrate Drink -provided for you  Drinking this carbohydrate drink up to two hours before surgery helps to reduce insulin resistance and improve patient outcomes. Please complete drinking 2 hours prior to scheduled arrival time.  Pilosec (TAKE THESE MEDICATIONS THE MORNING OF SURGERY WITH A SIP OF WATER)        One week prior to surgery: Stop Anti-inflammatories (NSAIDS) such as Advil, Aleve, Ibuprofen, Motrin, Naproxen, Naprosyn and Aspirin based products such as Excedrin, Goodys Powder, BC Powder. Stop ANY OVER THE COUNTER supplements until after surgery. You may however, continue to take Tylenol if needed for pain up until the day of surgery.  No Alcohol for 24 hours before or after surgery.  No Smoking including e-cigarettes for 24 hours prior to surgery.  No chewable tobacco products for at least 6 hours prior to surgery.  No nicotine patches on the day of surgery.  Do  not use any "recreational" drugs for at least a week prior to your surgery.  Please be advised that the combination of cocaine and anesthesia may have negative outcomes, up to and including death. If you test positive for cocaine, your surgery will be cancelled.  On the morning of surgery brush your teeth with toothpaste and water, you may rinse your mouth with mouthwash if you wish. Do not swallow any toothpaste or mouthwash.  Use CHG Soap or wipes as directed on instruction sheet.  Do not wear jewelry.  Do not wear lotions, powders, or perfumes.   Do not shave body from the neck down 48 hours prior to surgery just in case you cut yourself which could leave a site for infection.  Also, freshly shaved skin may become irritated if using the CHG soap.  Contact lenses, hearing aids and dentures may not be worn into surgery.  Do not bring valuables to the hospital. [facility] is not responsible for any missing/lost belongings or valuables.    Notify your doctor if there is any change in your medical condition (cold, fever, infection).  Wear comfortable clothing (specific to your surgery type) to the hospital.  After surgery, you can help prevent lung complications by doing breathing exercises.  Take deep breaths and cough every 1-2 hours. Your doctor may order a device called an Incentive Spirometer to help you take deep breaths.  When coughing or sneezing, hold a pillow firmly against your incision with both hands. This is called "splinting." Doing  this helps protect your incision. It also decreases belly discomfort.  If you are being admitted to the hospital overnight, leave your suitcase in the car. After surgery it may be brought to your room.  If you are being discharged the day of surgery, you will not be allowed to drive home. You will need a responsible adult (18 years or older) to drive you home and stay with you that night.   If you are taking public transportation, you  will need to have a responsible adult (18 years or older) with you. Please confirm with your physician that it is acceptable to use public transportation.   Please call the Pre-admissions Testing Dept. at [phone] if you have any questions about these instructions.  Surgery Visitation Policy:  Patients undergoing a surgery or procedure may have one family member or support person with them as long as that person is not COVID-19 positive or experiencing its symptoms.  That person may remain in the waiting area during the procedure and may rotate out with other people.  Inpatient Visitation:    Visiting hours are 7 a.m. to 8 p.m. Up to two visitors ages 16+ are allowed at one time in a patient room. The visitors may rotate out with other people during the day. Visitors must check out when they leave, or other visitors will not be allowed. One designated support person may remain overnight. The visitor must pass COVID-19 screenings, use hand sanitizer when entering and exiting the patient's room and wear a mask at all times, including in the patient's room. Patients must also wear a mask when staff or their visitor are in the room. Masking is required regardless of vaccination status.

## 2021-01-12 ENCOUNTER — Ambulatory Visit
Admission: RE | Admit: 2021-01-12 | Discharge: 2021-01-12 | Disposition: A | Discharge status: Home / Self Care | Payer: 59 | Attending: Surgery | Admitting: Surgery

## 2021-01-12 ENCOUNTER — Encounter
Admission: RE | Disposition: A | Discharge status: Home / Self Care | Payer: Self-pay | Source: Home / Self Care | Attending: Surgery

## 2021-01-12 ENCOUNTER — Ambulatory Visit: Payer: 59 | Admitting: Anesthesiology

## 2021-01-12 ENCOUNTER — Encounter: Payer: Self-pay | Admitting: Surgery

## 2021-01-12 ENCOUNTER — Other Ambulatory Visit: Payer: Self-pay

## 2021-01-12 DIAGNOSIS — K219 Gastro-esophageal reflux disease without esophagitis: Secondary | ICD-10-CM | POA: Insufficient documentation

## 2021-01-12 DIAGNOSIS — K8010 Calculus of gallbladder with chronic cholecystitis without obstruction: Secondary | ICD-10-CM | POA: Diagnosis present

## 2021-01-12 DIAGNOSIS — Z87891 Personal history of nicotine dependence: Secondary | ICD-10-CM | POA: Insufficient documentation

## 2021-01-12 DIAGNOSIS — Z8616 Personal history of COVID-19: Secondary | ICD-10-CM | POA: Insufficient documentation

## 2021-01-12 LAB — TYPE AND SCREEN
ABO/RH(D): A POS
Antibody Screen: NEGATIVE

## 2021-01-12 SURGERY — CHOLECYSTECTOMY, ROBOT-ASSISTED, LAPAROSCOPIC
Anesthesia: General

## 2021-01-12 MED ORDER — GLYCOPYRROLATE 0.2 MG/ML IJ SOLN
INTRAMUSCULAR | Status: DC | PRN
  Administered 2021-01-12: .2 mg via INTRAVENOUS

## 2021-01-12 MED ORDER — BUPIVACAINE LIPOSOME 1.3 % IJ SUSP
INTRAMUSCULAR | Status: AC
  Filled 2021-01-12: qty 20

## 2021-01-12 MED ORDER — CEFAZOLIN SODIUM-DEXTROSE 2-4 GM/100ML-% IV SOLN
2.0000 g | INTRAVENOUS | Status: AC
  Administered 2021-01-12: 2 g via INTRAVENOUS

## 2021-01-12 MED ORDER — ACETAMINOPHEN 500 MG PO TABS
ORAL_TABLET | ORAL | Status: AC
  Administered 2021-01-12: 1000 mg via ORAL
  Filled 2021-01-12: qty 2

## 2021-01-12 MED ORDER — FENTANYL CITRATE (PF) 100 MCG/2ML IJ SOLN
INTRAMUSCULAR | Status: AC
  Filled 2021-01-12: qty 2

## 2021-01-12 MED ORDER — BUPIVACAINE-EPINEPHRINE (PF) 0.25% -1:200000 IJ SOLN
INTRAMUSCULAR | Status: AC
  Filled 2021-01-12: qty 30

## 2021-01-12 MED ORDER — FENTANYL CITRATE (PF) 100 MCG/2ML IJ SOLN
INTRAMUSCULAR | Status: AC
  Administered 2021-01-12: 25 ug via INTRAVENOUS
  Filled 2021-01-12: qty 2

## 2021-01-12 MED ORDER — LIDOCAINE HCL (PF) 2 % IJ SOLN
INTRAMUSCULAR | Status: AC
  Filled 2021-01-12: qty 5

## 2021-01-12 MED ORDER — ONDANSETRON HCL 4 MG/2ML IJ SOLN: 4.0000 mg | Freq: Once | INTRAMUSCULAR | Status: DC | PRN

## 2021-01-12 MED ORDER — PROPOFOL 10 MG/ML IV BOLUS
INTRAVENOUS | Status: AC
  Filled 2021-01-12: qty 20

## 2021-01-12 MED ORDER — GABAPENTIN 300 MG PO CAPS
ORAL_CAPSULE | ORAL | Status: AC
  Administered 2021-01-12: 300 mg via ORAL
  Filled 2021-01-12: qty 1

## 2021-01-12 MED ORDER — BUPIVACAINE LIPOSOME 1.3 % IJ SUSP: 20.0000 mL | Freq: Once | INTRAMUSCULAR | Status: DC

## 2021-01-12 MED ORDER — CHLORHEXIDINE GLUCONATE CLOTH 2 % EX PADS: 6.0000 | MEDICATED_PAD | Freq: Once | CUTANEOUS | Status: DC

## 2021-01-12 MED ORDER — LIDOCAINE HCL (CARDIAC) PF 100 MG/5ML IV SOSY
PREFILLED_SYRINGE | INTRAVENOUS | Status: DC | PRN
  Administered 2021-01-12: 50 mg via INTRAVENOUS

## 2021-01-12 MED ORDER — GLYCOPYRROLATE 0.2 MG/ML IJ SOLN
INTRAMUSCULAR | Status: AC
  Filled 2021-01-12: qty 1

## 2021-01-12 MED ORDER — OXYCODONE HCL 5 MG PO TABS
ORAL_TABLET | ORAL | Status: AC
  Filled 2021-01-12: qty 1

## 2021-01-12 MED ORDER — GABAPENTIN 300 MG PO CAPS: 300.0000 mg | ORAL_CAPSULE | ORAL | Status: AC

## 2021-01-12 MED ORDER — CHLORHEXIDINE GLUCONATE 0.12 % MT SOLN: 15.0000 mL | Freq: Once | OROMUCOSAL | Status: AC

## 2021-01-12 MED ORDER — CHLORHEXIDINE GLUCONATE 0.12 % MT SOLN: 15.0000 mL | Freq: Once | OROMUCOSAL | Status: DC

## 2021-01-12 MED ORDER — INDOCYANINE GREEN 25 MG IV SOLR
2.5000 mg | Freq: Once | INTRAVENOUS | Status: AC
  Administered 2021-01-12: 2.5 mg via INTRAVENOUS
  Filled 2021-01-12: qty 1

## 2021-01-12 MED ORDER — PROPOFOL 10 MG/ML IV BOLUS
INTRAVENOUS | Status: DC | PRN
  Administered 2021-01-12: 200 mg via INTRAVENOUS

## 2021-01-12 MED ORDER — HYDROCODONE-ACETAMINOPHEN 5-325 MG PO TABS: 1.0000 | ORAL_TABLET | Freq: Four times a day (QID) | ORAL | 0 refills | Status: DC | PRN

## 2021-01-12 MED ORDER — ONDANSETRON HCL 4 MG/2ML IJ SOLN
INTRAMUSCULAR | Status: AC
  Filled 2021-01-12: qty 2

## 2021-01-12 MED ORDER — LACTATED RINGERS IV SOLN: INTRAVENOUS | Status: DC

## 2021-01-12 MED ORDER — ONDANSETRON HCL 4 MG/2ML IJ SOLN
INTRAMUSCULAR | Status: DC | PRN
  Administered 2021-01-12: 4 mg via INTRAVENOUS

## 2021-01-12 MED ORDER — OXYCODONE HCL 5 MG/5ML PO SOLN
5.0000 mg | Freq: Once | ORAL | Status: AC | PRN
Start: 2021-01-12 — End: 2021-01-12

## 2021-01-12 MED ORDER — HYDRALAZINE HCL 20 MG/ML IJ SOLN
10.0000 mg | Freq: Once | INTRAMUSCULAR | Status: AC
  Administered 2021-01-12: 10 mg via INTRAVENOUS

## 2021-01-12 MED ORDER — OXYCODONE HCL 5 MG PO TABS
5.0000 mg | ORAL_TABLET | Freq: Once | ORAL | Status: AC | PRN
  Administered 2021-01-12: 5 mg via ORAL

## 2021-01-12 MED ORDER — BUPIVACAINE-EPINEPHRINE (PF) 0.25% -1:200000 IJ SOLN
INTRAMUSCULAR | Status: DC | PRN
  Administered 2021-01-12: 20 mL

## 2021-01-12 MED ORDER — ROCURONIUM BROMIDE 10 MG/ML (PF) SYRINGE
PREFILLED_SYRINGE | INTRAVENOUS | Status: AC
  Filled 2021-01-12: qty 10

## 2021-01-12 MED ORDER — DEXMEDETOMIDINE (PRECEDEX) IN NS 20 MCG/5ML (4 MCG/ML) IV SYRINGE
PREFILLED_SYRINGE | INTRAVENOUS | Status: AC
  Filled 2021-01-12: qty 5

## 2021-01-12 MED ORDER — 0.9 % SODIUM CHLORIDE (POUR BTL) OPTIME
TOPICAL | Status: DC | PRN
  Administered 2021-01-12: 500 mL

## 2021-01-12 MED ORDER — FENTANYL CITRATE (PF) 100 MCG/2ML IJ SOLN
25.0000 ug | INTRAMUSCULAR | Status: DC | PRN
  Administered 2021-01-12 (×2): 25 ug via INTRAVENOUS

## 2021-01-12 MED ORDER — ACETAMINOPHEN 500 MG PO TABS: 1000.0000 mg | ORAL_TABLET | ORAL | Status: AC

## 2021-01-12 MED ORDER — CEFAZOLIN SODIUM-DEXTROSE 2-4 GM/100ML-% IV SOLN
INTRAVENOUS | Status: AC
  Filled 2021-01-12: qty 100

## 2021-01-12 MED ORDER — HYDRALAZINE HCL 20 MG/ML IJ SOLN
INTRAMUSCULAR | Status: AC
  Filled 2021-01-12: qty 1

## 2021-01-12 MED ORDER — CHLORHEXIDINE GLUCONATE 0.12 % MT SOLN
OROMUCOSAL | Status: AC
  Administered 2021-01-12: 15 mL via OROMUCOSAL
  Filled 2021-01-12: qty 15

## 2021-01-12 MED ORDER — SUGAMMADEX SODIUM 200 MG/2ML IV SOLN
INTRAVENOUS | Status: DC | PRN
  Administered 2021-01-12: 200 mg via INTRAVENOUS

## 2021-01-12 MED ORDER — MIDAZOLAM HCL 2 MG/2ML IJ SOLN
INTRAMUSCULAR | Status: AC
  Filled 2021-01-12: qty 2

## 2021-01-12 MED ORDER — ORAL CARE MOUTH RINSE: 15.0000 mL | Freq: Once | OROMUCOSAL | Status: AC

## 2021-01-12 MED ORDER — DEXAMETHASONE SODIUM PHOSPHATE 10 MG/ML IJ SOLN
INTRAMUSCULAR | Status: DC | PRN
  Administered 2021-01-12: 10 mg via INTRAVENOUS

## 2021-01-12 MED ORDER — ORAL CARE MOUTH RINSE: 15.0000 mL | Freq: Once | OROMUCOSAL | Status: DC

## 2021-01-12 MED ORDER — DEXMEDETOMIDINE (PRECEDEX) IN NS 20 MCG/5ML (4 MCG/ML) IV SYRINGE
PREFILLED_SYRINGE | INTRAVENOUS | Status: DC | PRN
  Administered 2021-01-12: 12 ug via INTRAVENOUS
  Administered 2021-01-12: 4 ug via INTRAVENOUS

## 2021-01-12 MED ORDER — CELECOXIB 200 MG PO CAPS
200.0000 mg | ORAL_CAPSULE | ORAL | Status: AC
Start: 2021-01-12 — End: 2021-01-12

## 2021-01-12 MED ORDER — ACETAMINOPHEN 10 MG/ML IV SOLN: 1000.0000 mg | Freq: Once | INTRAVENOUS | Status: DC | PRN

## 2021-01-12 MED ORDER — IBUPROFEN 800 MG PO TABS: 800.0000 mg | ORAL_TABLET | Freq: Three times a day (TID) | ORAL | 0 refills | Status: DC | PRN

## 2021-01-12 MED ORDER — DEXAMETHASONE SODIUM PHOSPHATE 10 MG/ML IJ SOLN
INTRAMUSCULAR | Status: AC
  Filled 2021-01-12: qty 1

## 2021-01-12 MED ORDER — FENTANYL CITRATE (PF) 100 MCG/2ML IJ SOLN
INTRAMUSCULAR | Status: DC | PRN
  Administered 2021-01-12 (×4): 50 ug via INTRAVENOUS

## 2021-01-12 MED ORDER — ROCURONIUM BROMIDE 100 MG/10ML IV SOLN
INTRAVENOUS | Status: DC | PRN
  Administered 2021-01-12: 50 mg via INTRAVENOUS

## 2021-01-12 MED ORDER — MIDAZOLAM HCL 2 MG/2ML IJ SOLN
INTRAMUSCULAR | Status: DC | PRN
  Administered 2021-01-12: 2 mg via INTRAVENOUS

## 2021-01-12 MED ORDER — CELECOXIB 200 MG PO CAPS
ORAL_CAPSULE | ORAL | Status: AC
  Administered 2021-01-12: 200 mg via ORAL
  Filled 2021-01-12: qty 1

## 2021-01-12 SURGICAL SUPPLY — 49 items
ADH SKN CLS APL DERMABOND .7 (GAUZE/BANDAGES/DRESSINGS) ×2
APL PRP STRL LF DISP 70% ISPRP (MISCELLANEOUS) ×2
BAG SPEC RTRVL LRG 6X4 10 (ENDOMECHANICALS) ×2
CANNULA CAP OBTURATR AIRSEAL 8 (CAP) ×3 IMPLANT
CHLORAPREP W/TINT 26 (MISCELLANEOUS) ×3 IMPLANT
CLIP VESOLOCK LG 6/CT PURPLE (CLIP) ×3 IMPLANT
COVER TIP SHEARS 8 DVNC (MISCELLANEOUS) ×2 IMPLANT
COVER TIP SHEARS 8MM DA VINCI (MISCELLANEOUS) ×1
DECANTER SPIKE VIAL GLASS SM (MISCELLANEOUS) ×3 IMPLANT
DEFOGGER SCOPE WARMER CLEARIFY (MISCELLANEOUS) ×3 IMPLANT
DERMABOND ADVANCED (GAUZE/BANDAGES/DRESSINGS) ×1
DERMABOND ADVANCED .7 DNX12 (GAUZE/BANDAGES/DRESSINGS) ×2 IMPLANT
DRAPE ARM DVNC X/XI (DISPOSABLE) ×8 IMPLANT
DRAPE COLUMN DVNC XI (DISPOSABLE) ×2 IMPLANT
DRAPE DA VINCI XI ARM (DISPOSABLE) ×4
DRAPE DA VINCI XI COLUMN (DISPOSABLE) ×1
ELECT CAUTERY BLADE 6.4 (BLADE) ×3 IMPLANT
GAUZE 4X4 16PLY ~~LOC~~+RFID DBL (SPONGE) ×3 IMPLANT
GLOVE SURG ORTHO LTX SZ7.5 (GLOVE) ×18 IMPLANT
GOWN STRL REUS W/ TWL LRG LVL3 (GOWN DISPOSABLE) ×8 IMPLANT
GOWN STRL REUS W/TWL LRG LVL3 (GOWN DISPOSABLE) ×12
GRASPER SUT TROCAR 14GX15 (MISCELLANEOUS) IMPLANT
INFUSOR MANOMETER BAG 3000ML (MISCELLANEOUS) IMPLANT
IRRIGATION STRYKERFLOW (MISCELLANEOUS) IMPLANT
IRRIGATOR STRYKERFLOW (MISCELLANEOUS)
IRRIGATOR SUCT 8 DISP DVNC XI (IRRIGATION / IRRIGATOR) IMPLANT
IRRIGATOR SUCTION 8MM XI DISP (IRRIGATION / IRRIGATOR)
IV NS IRRIG 3000ML ARTHROMATIC (IV SOLUTION) IMPLANT
KIT PINK PAD W/HEAD ARE REST (MISCELLANEOUS) ×3
KIT PINK PAD W/HEAD ARM REST (MISCELLANEOUS) ×2 IMPLANT
KIT TURNOVER KIT A (KITS) ×3 IMPLANT
LABEL OR SOLS (LABEL) ×3 IMPLANT
MANIFOLD NEPTUNE II (INSTRUMENTS) ×3 IMPLANT
NEEDLE HYPO 22GX1.5 SAFETY (NEEDLE) ×3 IMPLANT
NEEDLE INSUFFLATION 14GA 120MM (NEEDLE) IMPLANT
NS IRRIG 500ML POUR BTL (IV SOLUTION) ×3 IMPLANT
PACK LAP CHOLECYSTECTOMY (MISCELLANEOUS) ×3 IMPLANT
PENCIL ELECTRO HAND CTR (MISCELLANEOUS) ×3 IMPLANT
POUCH SPECIMEN RETRIEVAL 10MM (ENDOMECHANICALS) ×3 IMPLANT
SEAL CANN UNIV 5-8 DVNC XI (MISCELLANEOUS) ×6 IMPLANT
SEAL XI 5MM-8MM UNIVERSAL (MISCELLANEOUS) ×3
SET TUBE FILTERED XL AIRSEAL (SET/KITS/TRAYS/PACK) ×3 IMPLANT
SOLUTION ELECTROLUBE (MISCELLANEOUS) ×3 IMPLANT
SUT MNCRL 4-0 (SUTURE) ×3
SUT MNCRL 4-0 27XMFL (SUTURE) ×2
SUT VICRYL 0 AB UR-6 (SUTURE) ×3 IMPLANT
SUTURE MNCRL 4-0 27XMF (SUTURE) ×2 IMPLANT
TROCAR Z-THREAD FIOS 11X100 BL (TROCAR) ×3 IMPLANT
WATER STERILE IRR 500ML POUR (IV SOLUTION) IMPLANT

## 2021-01-12 NOTE — Interval H&P Note (Signed)
History and Physical Interval Note:  01/12/2021 7:27 AM  Tristan Salinas  has presented today for surgery, with the diagnosis of chronic calculous cholecystitis.  The various methods of treatment have been discussed with the patient and family. After consideration of risks, benefits and other options for treatment, the patient has consented to  Procedure(s): XI ROBOTIC ASSISTED LAPAROSCOPIC CHOLECYSTECTOMY (N/A) as a surgical intervention.  The patient's history has been reviewed, patient examined, no change in status, stable for surgery.  I have reviewed the patient's chart and labs.  Questions were answered to the patient's satisfaction.     Campbell Lerner

## 2021-01-12 NOTE — Anesthesia Preprocedure Evaluation (Signed)
Anesthesia Evaluation  Patient identified by MRN, date of birth, ID band Patient awake    Reviewed: Allergy & Precautions, NPO status , Patient's Chart, lab work & pertinent test results  History of Anesthesia Complications Negative for: history of anesthetic complications  Airway Mallampati: I   Neck ROM: Full    Dental no notable dental hx.    Pulmonary former smoker (quit 10/2020),    Pulmonary exam normal breath sounds clear to auscultation       Cardiovascular hypertension, Normal cardiovascular exam Rhythm:Regular Rate:Normal     Neuro/Psych negative neurological ROS     GI/Hepatic GERD  ,Chronic calculous cholecystitis   Endo/Other  negative endocrine ROS  Renal/GU negative Renal ROS     Musculoskeletal   Abdominal   Peds  Hematology negative hematology ROS (+)   Anesthesia Other Findings   Reproductive/Obstetrics                             Anesthesia Physical Anesthesia Plan  ASA: 2  Anesthesia Plan: General   Post-op Pain Management:    Induction: Intravenous  PONV Risk Score and Plan: 2 and Ondansetron, Dexamethasone and Treatment may vary due to age or medical condition  Airway Management Planned: Oral ETT  Additional Equipment:   Intra-op Plan:   Post-operative Plan: Extubation in OR  Informed Consent: I have reviewed the patients History and Physical, chart, labs and discussed the procedure including the risks, benefits and alternatives for the proposed anesthesia with the patient or authorized representative who has indicated his/her understanding and acceptance.       Plan Discussed with: CRNA  Anesthesia Plan Comments:         Anesthesia Quick Evaluation

## 2021-01-12 NOTE — Op Note (Signed)
Robotic cholecystectomy with Indocyamine Green Ductal Imaging.   Pre-operative Diagnosis: Chronic calculus cholecystitis  Post-operative Diagnosis:  Same.  Procedure: Robotic assisted laparoscopic cholecystectomy with Indocyamine Green Ductal Imaging.   Surgeon: Campbell Lerner, M.D., FACS  Anesthesia: General. with endotracheal tube  Findings: Contracted gallbladder, chronic scarring of the triangle of Colot.     Estimated Blood Loss: <5 mL         Drains: None         Specimens: Gallbladder           Complications: none  Procedure Details  The patient was seen again in the Holding Room.  2.5 mg dose of ICG was administered intravenously.   The benefits, complications, treatment options, risks and expected outcomes were again reviewed with the patient. The likelihood of improving the patient's symptoms with return to their baseline status is good.  The patient and/or family concurred with the proposed plan, giving informed consent, again alternatives reviewed.  The patient was taken to Operating Room, identified, and the procedure verified as robotic assisted laparoscopic cholecystectomy.  Prior to the induction of general anesthesia, antibiotic prophylaxis was administered. VTE prophylaxis was in place. General endotracheal anesthesia was then administered and tolerated well. The patient was positioned in the supine position.  After the induction, the abdomen was prepped with Chloraprep and draped in the sterile fashion.  A Time Out was held and the above information confirmed.  Right infra-umbilical local infiltration with quarter percent Marcaine with epinephrine is utilized.  Made a 12 mm incision on the right periumbilical site, I advanced an optical 86mm port under direct visualization into the peritoneal cavity.  Once the peritoneum was penetrated, insufflation was initiated.  The trocar was then advanced into the abdominal cavity under direct visualization. Pneumoperitoneum  was then continued with Air seal utilizing CO2 at 15 mmHg or less and tolerated well without any adverse changes in the patient's vital signs.  Two 8.5-mm ports were placed in the left lower quadrant and laterally, and one to the right lower quadrant, all under direct vision. All skin incisions  were infiltrated with a local anesthetic agent before making the incision and placing the trocars.  The patient was positioned  in reverse Trendelenburg, tilted the patient's left side down.  Da Vinci XI robot was then positioned on to the patient's left side, and docked.  The gallbladder was identified, the fundus grasped via the arm 4 Prograsp and retracted cephalad. Adhesions were lysed with scissors and cautery.  The infundibulum was identified grasped and retracted laterally, exposing the peritoneum overlying the triangle of Calot. This was then opened and dissected using cautery & scissors.  Meticulous dissection to stay on plane between the gallbladder and its lymph node, unable to assist complete exposure of the cystic artery.  An extended critical view of the cystic duct and cystic artery was obtained, aided by the ICG via FireFly which enabled ready visualization of the ductal anatomy.    The cystic duct was clearly identified and dissected to isolation.   Artery well isolated and clipped, and the cystic duct was triple clipped and divided with scissors, as close to the gallbladder neck as feasible, thus leaving two on the remaining stump.    The specimen side of the artery is sealed with bipolar and divided with monopolar scissors.   The gallbladder was taken from the gallbladder fossa in a retrograde fashion with the electrocautery. The gallbladder was removed and placed in an Endocatch bag.  The liver bed  is inspected. Hemostasis was confirmed.  The robot was undocked and moved away from the operative field. No irrigation was utilized.  The gallbladder and Endocatch sac were then removed through the  infraumbilical port site.  No dilation necessary.  Inspection of the right upper quadrant was performed. No bleeding, bile duct injury or leak, or bowel injury was noted. The infra-umbilical port site fascia was closed with interrumpted 0 Vicryl suture using PMI/cone under direct visualization. Pneumoperitoneum was released and ports removed.  4-0 subcuticular Monocryl was used to close the skin. Dermabond was  applied.  The patient was then extubated and brought to the recovery room in stable condition. Sponge, lap, and needle counts were correct at closure and at the conclusion of the case.               Campbell Lerner, M.D., [facility] 01/12/2021 8:58 AM

## 2021-01-12 NOTE — Anesthesia Procedure Notes (Signed)
Procedure Name: Intubation Date/Time: 01/12/2021 7:45 AM Performed by: Mathews Argyle, CRNA Pre-anesthesia Checklist: Patient identified, Patient being monitored, Timeout performed, Emergency Drugs available and Suction available Patient Re-evaluated:Patient Re-evaluated prior to induction Oxygen Delivery Method: Circle system utilized Preoxygenation: Pre-oxygenation with 100% oxygen Induction Type: IV induction Ventilation: Mask ventilation without difficulty Laryngoscope Size: McGraph and 4 Grade View: Grade I Tube type: Oral Tube size: 7.5 mm Number of attempts: 1 Airway Equipment and Method: Stylet and Video-laryngoscopy Placement Confirmation: ETT inserted through vocal cords under direct vision, positive ETCO2 and breath sounds checked- equal and bilateral Secured at: 23 cm Tube secured with: Tape Dental Injury: Teeth and Oropharynx as per pre-operative assessment

## 2021-01-12 NOTE — Anesthesia Postprocedure Evaluation (Signed)
Anesthesia Post Note  Patient: Matthias Bogus  Procedure(s) Performed: XI ROBOTIC ASSISTED LAPAROSCOPIC CHOLECYSTECTOMY INDOCYANINE GREEN FLUORESCENCE IMAGING (ICG)  Patient location during evaluation: PACU Anesthesia Type: General Level of consciousness: awake and alert, oriented and patient cooperative Pain management: pain level controlled Vital Signs Assessment: post-procedure vital signs reviewed and stable Respiratory status: spontaneous breathing, nonlabored ventilation and respiratory function stable Cardiovascular status: blood pressure returned to baseline and stable Postop Assessment: adequate PO intake Anesthetic complications: no   No notable events documented.   Last Vitals:  Vitals:   01/12/21 0941 01/12/21 0953  BP: (!) 141/87 (!) 147/98  Pulse: (!) 105 90  Resp: 17 18  Temp:  (!) 35.9 C  SpO2: 99% 100%    Last Pain:  Vitals:   01/12/21 0953  TempSrc: Temporal  PainSc: 5                  Reed Breech

## 2021-01-12 NOTE — Discharge Instructions (Signed)

## 2021-01-12 NOTE — Transfer of Care (Signed)
Immediate Anesthesia Transfer of Care Note  Patient: Tristan Salinas  Procedure(s) Performed: XI ROBOTIC ASSISTED LAPAROSCOPIC CHOLECYSTECTOMY INDOCYANINE GREEN FLUORESCENCE IMAGING (ICG)  Patient Location: PACU  Anesthesia Type:General  Level of Consciousness: drowsy  Airway & Oxygen Therapy: Patient Spontanous Breathing and Patient connected to face mask oxygen  Post-op Assessment: Report given to RN and Post -op Vital signs reviewed and stable  Post vital signs: Reviewed  Last Vitals:  Vitals Value Taken Time  BP 144/103 01/12/21 0852  Temp    Pulse 67 01/12/21 0852  Resp 9 01/12/21 0852  SpO2 100 % 01/12/21 0852  Vitals shown include unvalidated device data.  Last Pain:  Vitals:   01/12/21 0624  TempSrc: Oral  PainSc: 0-No pain         Complications: No notable events documented.

## 2021-01-12 NOTE — Progress Notes (Signed)
   01/12/21 0735  Clinical Encounter Type  Visited With Patient  Visit Type Initial;Spiritual support;Social support  Referral From Chaplain  Consult/Referral To [employer] met PT, while doing routine rounding.Offered emotional and spiritual support. No family was present at bedside at the time of the visit.   Andee Poles, M. Div.

## 2021-01-13 LAB — SURGICAL PATHOLOGY

## 2021-01-30 ENCOUNTER — Other Ambulatory Visit: Payer: Self-pay

## 2021-01-30 ENCOUNTER — Encounter: Payer: Self-pay | Admitting: Physician Assistant

## 2021-01-30 ENCOUNTER — Ambulatory Visit (INDEPENDENT_AMBULATORY_CARE_PROVIDER_SITE_OTHER): Payer: 59 | Admitting: Physician Assistant

## 2021-01-30 VITALS — BP 144/96 | HR 80 | Temp 98.7°F | Ht 72.0 in | Wt 210.0 lb

## 2021-01-30 DIAGNOSIS — Z09 Encounter for follow-up examination after completed treatment for conditions other than malignant neoplasm: Secondary | ICD-10-CM

## 2021-01-30 DIAGNOSIS — K8010 Calculus of gallbladder with chronic cholecystitis without obstruction: Secondary | ICD-10-CM

## 2021-01-30 MED ORDER — CYCLOBENZAPRINE HCL 5 MG PO TABS: 5.0000 mg | ORAL_TABLET | Freq: Every day | ORAL | 0 refills | Status: DC

## 2021-01-30 NOTE — Patient Instructions (Addendum)
The diarrhea should get better in 1-2 months. Let us know if it continues. You may take Imodium if the diarrhea is bothersome.   You are ok to take Ibuprofen or Advil for any pain or discomfort.   We have sent you in a prescription for Flexeril to help with muscle spasms. You should take this in the evening, but it can be taken up to three times a day.    Follow-up with our office as needed.  Please call and ask to speak with a nurse if you develop questions or concerns.   GENERAL POST-OPERATIVE PATIENT INSTRUCTIONS   WOUND CARE INSTRUCTIONS: Try to keep the wound dry and avoid ointments on the wound unless directed to do so.  If the wound becomes bright red and painful or starts to drain infected material that is not clear, please contact your physician immediately.  If the wound is mildly pink and has a thick firm ridge underneath it, this is normal, and is referred to as a healing ridge.  This will resolve over the next 4-6 weeks.  BATHING: You may shower if you have been informed of this by your surgeon. However, Please do not submerge in a tub, hot tub, or pool until incisions are completely sealed or have been told by your surgeon that you may do so.  DIET:  You may eat any foods that you can tolerate.  It is a good idea to eat a high fiber diet and take in plenty of fluids to prevent constipation.  If you do become constipated you may want to take a mild laxative or take ducolax tablets on a daily basis until your bowel habits are regular.  Constipation can be very uncomfortable, along with straining, after recent surgery.  ACTIVITY:  You are encouraged to walk and engage in light activity for the next two weeks.  You should not lift more than 20 pounds for 6 weeks after surgery as it could put you at increased risk for complications.  Twenty pounds is roughly equivalent to a plastic bag of groceries. At that time- Listen to your body when lifting, if you have pain when lifting, stop and  then try again in a few days. Soreness after doing exercises or activities of daily living is normal as you get back in to your normal routine.  MEDICATIONS:  Try to take narcotic medications and anti-inflammatory medications, such as tylenol, ibuprofen, naprosyn, etc., with food.  This will minimize stomach upset from the medication.  Should you develop nausea and vomiting from the pain medication, or develop a rash, please discontinue the medication and contact your physician.  You should not drive, make important decisions, or operate machinery when taking narcotic pain medication.  SUNBLOCK Use sun block to incision area over the next year if this area will be exposed to sun. This helps decrease scarring and will allow you avoid a permanent darkened area over your incision.  QUESTIONS:  Please feel free to call our office if you have any questions, and we will be glad to assist you.

## 2021-01-30 NOTE — Progress Notes (Signed)
[facility] POST-OP OFFICE VISIT  01/30/2021  HPI: Tristan Salinas is a 30 y.o. male 18 days s/p robotic assisted laparoscopic cholecystectomy for [facility] with Dr Claudine Mouton.   He is doing reasonably well at home since surgery. He does have some right sided abdominal pain which sounds like musculoskeletal in nature. Worse with bending over or exertion. He is not taking nay pain medication for this. No fever, chills, nausea, emesis. He continues to have some intermittent diarrhea but this is overall improved compared to before surgery. No issues with PO intake.   Vital signs: BP (!) 144/96   Pulse 80   Temp 98.7 F (37.1 C)   Ht 6' (1.829 m)   Wt 210 lb (95.3 kg)   SpO2 99%   BMI 28.48 kg/m    Physical Exam: Constitutional: Well appearing male, NAD Abdomen: Soft, non-tender, non-distended, no rebound/guarding Skin: Laparoscopic incisions are well healed, no erythema or drainage   Assessment/Plan: This is a 30 y.o. male 18 days s/p robotic assisted laparoscopic cholecystectomy for [facility]   - Pain control prn; encouraged ibuprofen; will add flexeril QHS for MSK pain  - Imodium prn for diarrhea  - Reviewed wound care  - Reviewed lifting restrictions; 4 weeks total; work note given  - Reviewed surgucal pathology: [facility]  - He can rtc on as needed basis. He will call with questions/concerns   -- Lynden Oxford, PA-C [facility] 01/30/2021, 9:53 AM [phone] M-F: 7am - 4pm

## 2021-02-02 ENCOUNTER — Ambulatory Visit: Payer: Self-pay | Admitting: Family Medicine

## 2021-02-05 ENCOUNTER — Other Ambulatory Visit: Payer: Self-pay | Admitting: Gastroenterology

## 2021-03-02 ENCOUNTER — Ambulatory Visit: Payer: 59 | Admitting: Gastroenterology

## 2021-11-15 NOTE — Progress Notes (Signed)
 Subjective:  Patient ID: Tristan Salinas is a 31 y.o. male.  Chief Complaint  Patient presents with  . Covid Testing Visit [facility]  . Mouth or throat complaint     HPI  Mouth or throat complaint Patient presents with: sore throat   Location:  Generalized Quality:  Sore Onset Quality:  Sudden Severity:  Moderate Duration of current symptoms:  2 days Timing:  Gradually worsening Chronicity:  New Ineffective treatments: Tylenol , Hall's cough drops  Response to treatments:  No relief Associated symptoms: cough, fatigue, trouble swallowing, headache, ear pain, neck pain, rhinorrhea, sore throat and swollen glands   Associated symptoms: no abdominal pain, no chest pain, no muffled voice, no shortness of breath and no fever          Review of Systems  Constitutional: Positive for fatigue. Negative for fever.  HENT: Positive for ear pain, rhinorrhea, sore throat and trouble swallowing. Negative for voice change.   Respiratory: Positive for cough. Negative for shortness of breath.   Cardiovascular: Negative.  Negative for chest pain.  Gastrointestinal: Negative.  Negative for abdominal pain.  Musculoskeletal: Positive for neck pain.  Skin: Negative.   Neurological: Negative.   All other systems reviewed and are negative.   Social History   Tobacco Use  Smoking Status Never  . Passive exposure: Never  Smokeless Tobacco Never   Past Medical History:  Diagnosis Date  . Hypercholesterolemia   . Hypertension    Past Surgical History:  Procedure Laterality Date  . CHOLECYSTECTOMY     History reviewed. No pertinent family history. Objective:      Physical Exam Constitutional:      Appearance: Normal appearance.  HENT:     Head: Normocephalic.     Right Ear: Tympanic membrane, ear canal and external ear normal.     Left Ear: Tympanic membrane, ear canal and external ear normal.     Nose: Nose normal. No congestion or rhinorrhea.     Mouth/Throat:     Mouth: Mucous  membranes are moist.     Pharynx: Uvula midline. Pharyngeal swelling and posterior oropharyngeal erythema present. No oropharyngeal exudate.     Tonsils: No tonsillar exudate. 1+ on the right. 1+ on the left.  Eyes:     Conjunctiva/sclera: Conjunctivae normal.  Cardiovascular:     Rate and Rhythm: Normal rate and regular rhythm.     Pulses: Normal pulses.     Heart sounds: Normal heart sounds.  Pulmonary:     Effort: Pulmonary effort is normal.     Breath sounds: Normal breath sounds. No wheezing or rhonchi.  Chest:     Chest wall: No tenderness.  Abdominal:     Tenderness: There is no abdominal tenderness.  Lymphadenopathy:     Cervical: No cervical adenopathy.  Skin:    General: Skin is warm and dry.  Neurological:     Mental Status: He is alert and oriented to person, place, and time.  Psychiatric:        Mood and Affect: Mood normal.        Behavior: Behavior normal.       Assessment/Plan:  HPI provided by Self  Based on today's visit:history, physical exam and all relevant testing completed in clinic today patient's visit diagnosis is/includes  1. Acute pharyngitis, unspecified etiology   2. Sore throat   3. Encounter for observation for suspected exposure to other biological agents ruled out   4. Essential hypertension    Patient does not have a history  of chronic conditions.  Treatment plan includes:  Orders Placed: Orders Placed This Encounter  Procedures  . LumiraDX SARS-COV-2 Rapid Result Antigen Test  . Strep Molecular POCT   Medications ordered this visit   Signed Prescriptions Disp Refills  . acetaminophen  325 mg cap 30 capsule 0    Sig: Take 1-2 capsules by mouth 4 (four) times a day for 7 days Max 12 capsules in 24 hours.  . fexofenadine (ALLEGRA) 180 MG tablet 30 tablet 11    Sig: Take 1 tablet (180 mg total) by mouth daily    Current medication list and any new medications prescribed or recommended today were reviewed with the patient and  specific instructions were provided Yes  Provider Recommendations   Advised patient to take Tylenol / Ibuprofen  as needed for inflammation and symptom relief.  Salt water gargles  Hot/cold beverages for soothing  Vocal rest  Drink plenty of fluids   Discussed that symptoms appear allergy related.  Continue to monitor for worsening symptoms.  Discussed medications like Zyrtec, Claritin, Allegra, Benadryl, Astepro, and Flonase.  Patient understands to follow up with [facility]/ PCP or UC for any of the following symptoms: Chest pain, severe shortness of breath, fever/chills not responsive to treatment, and/or persistent uncontrolled nausea/vomiting or diarrhea. All questions answered to patient's satisfaction and patient verbalizes understanding of symptomatic treatment. Educated patient regarding hand hygiene, avoiding triggers, obtain adequate rest and stay hydrated as possible.    Elevated Blood Pressure -Follow-up with PCP or [facility] in Followup times: 2 weeks for a blood pressure recheck. Patient advised to: Continue medications as prescribed by PCP Monitor blood pressure as recommended and keep log for review Lifestyle modifications: eat a well-balanced diet, limit alcohol, enjoy regular physical activity, manage stress, maintain healthy weight, quit smoking, and take medication as prescribed  Avoid use of decongestants (Sudafed, pseudoephedrine, and phenylephrine) due to high blood pressure. Decongestants increase blood pressure that may remain elevated for extended periods of time. Discussed taking blood pressure safe medications, such as Coricidin HBP as needed  Patient has not been on blood pressure medication x 1 year, has upcoming appointment with PCP this month for follow up.  Patient asymptomatic today-- no chest pain, shortness of breath, blurred vision, or jaw/arm pain, or nausea; advised patient to continue to monitor for symptoms and record BP reading and keep scheduled appointment.           Follow up care instructions were provided and reviewed?with the  Patient. All questions were answered. Patient verbalized understanding of plan of care today.                                         COVID Oral Antiviral Eligibility Status: Does not meet eligibility: Due to negative test  State Department of Health notified of results per regulations

## 2022-05-19 IMAGING — CT CT ABD-PELV W/ CM
2 of 4 series · 15 of 46 positions shown, 17 images · IV contrast (APPLIED)
Comparison: None.

CLINICAL DATA: Worsening pain after ultrasound-guided liver biopsy
12/24/2020.

EXAM:
CT ABDOMEN AND PELVIS WITH CONTRAST
TECHNIQUE: Multidetector CT imaging of the abdomen and pelvis was performed
using the standard protocol following bolus administration of
intravenous contrast.
CONTRAST:  100mL OMNIPAQUE IOHEXOL 350 MG/ML SOLN

[Series 2: routine abd/pel with · axial · 0.94mm/px · z∈[-1072,-582]mm · 12 of 112 slices shown, 14 images]
[im 9/112  soft-tissue]
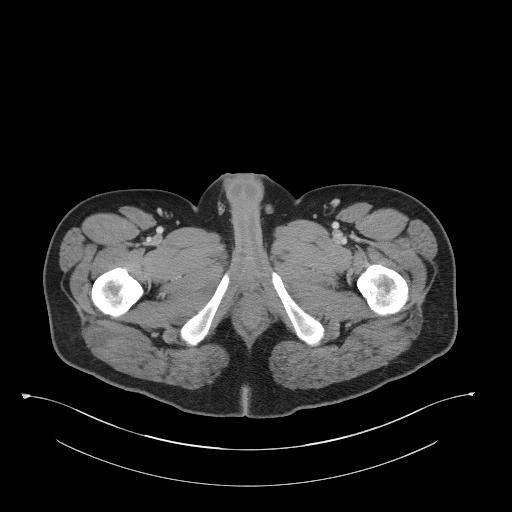
[im 9/112  bone]
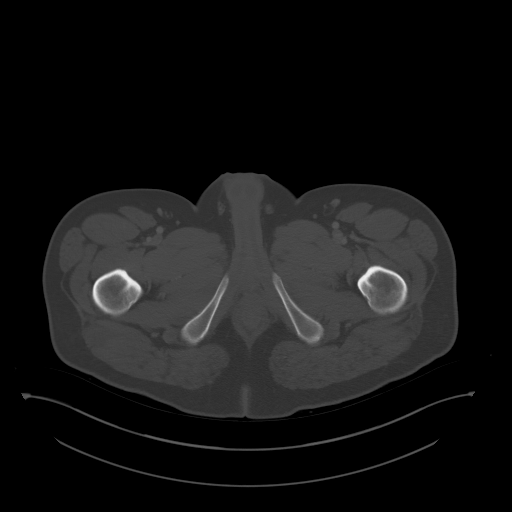
[im 18/112  soft-tissue]
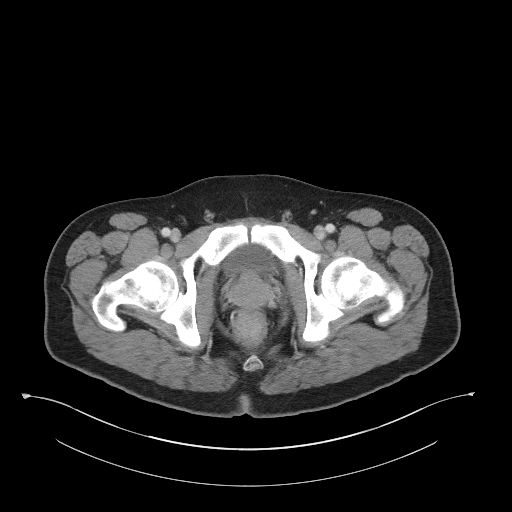
[im 27/112  soft-tissue]
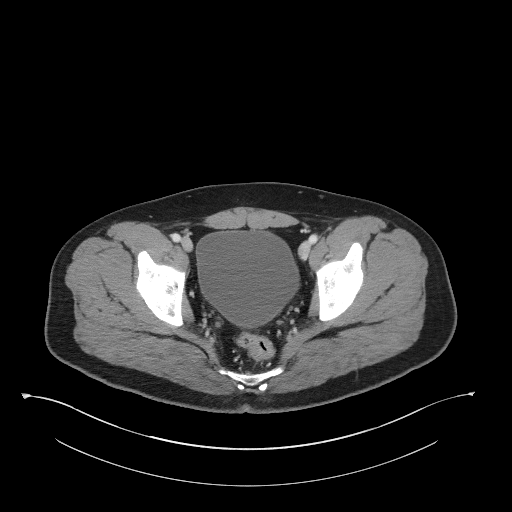
[im 36/112  soft-tissue]
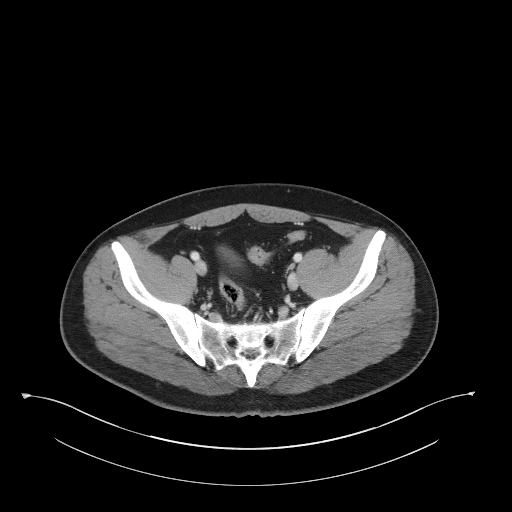
[im 45/112  soft-tissue]
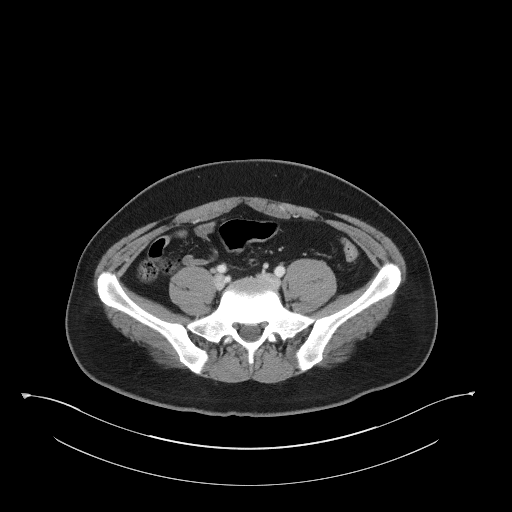
[im 54/112  soft-tissue]
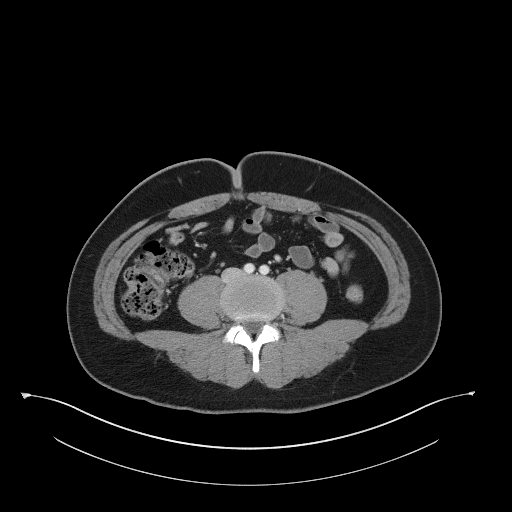
[im 63/112  soft-tissue]
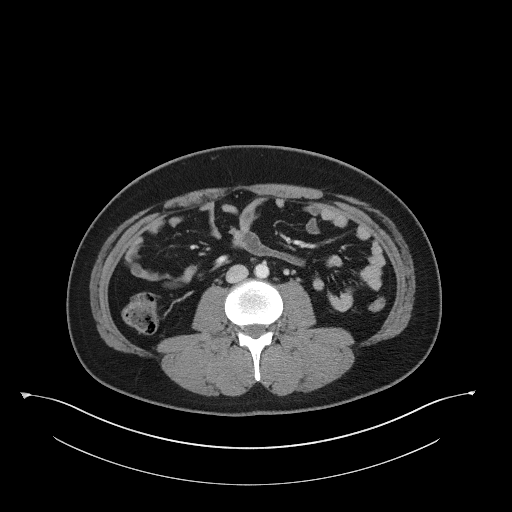
[im 72/112  soft-tissue]
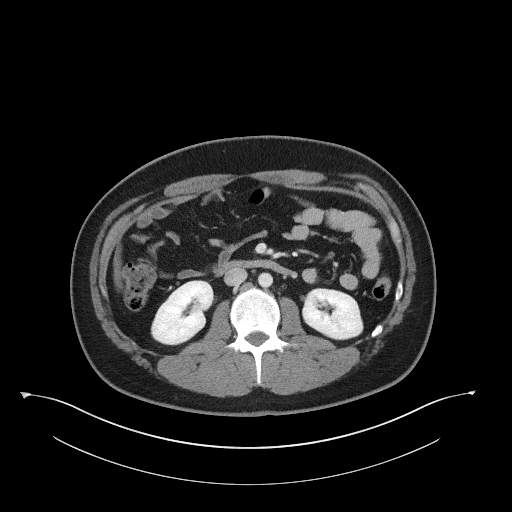
[im 80/112  soft-tissue]
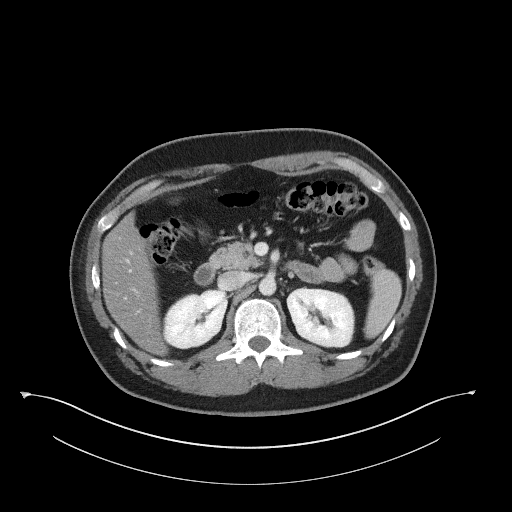
[im 80/112  bone]
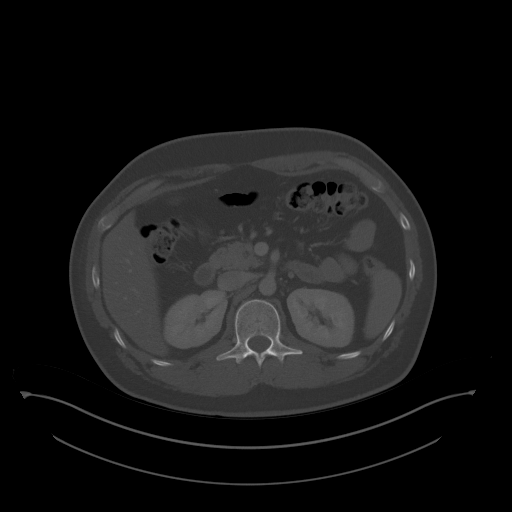
[im 89/112  soft-tissue]
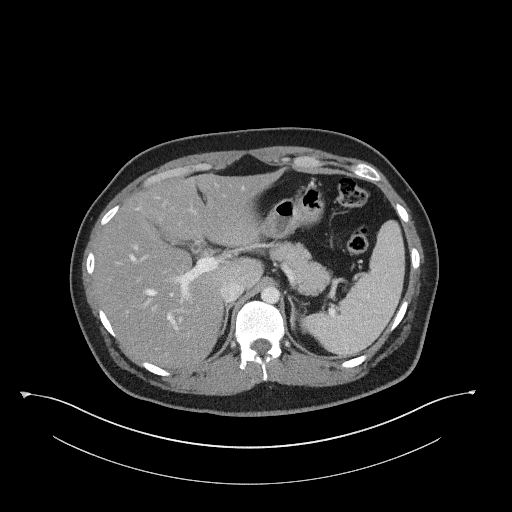
[im 98/112  soft-tissue]
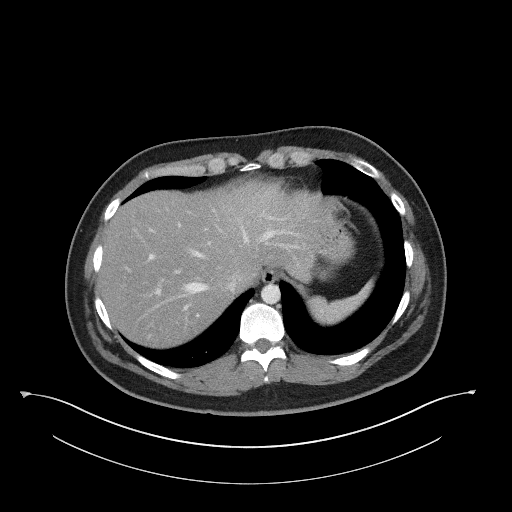
[im 107/112  soft-tissue]
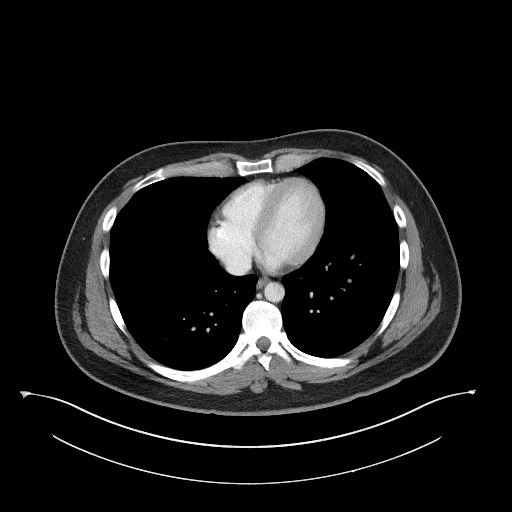

[Series 5: coronal st · coronal · 0.79mm/px · 3 of 93 slices shown]
[im 31/93  soft-tissue]
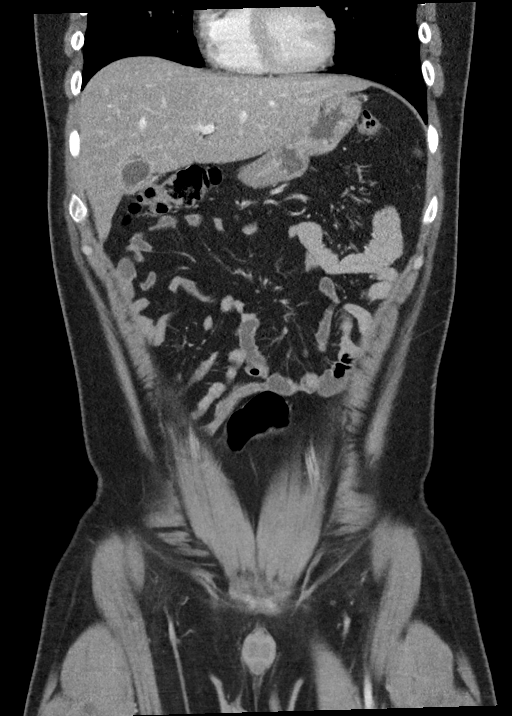
[im 41/93  soft-tissue]
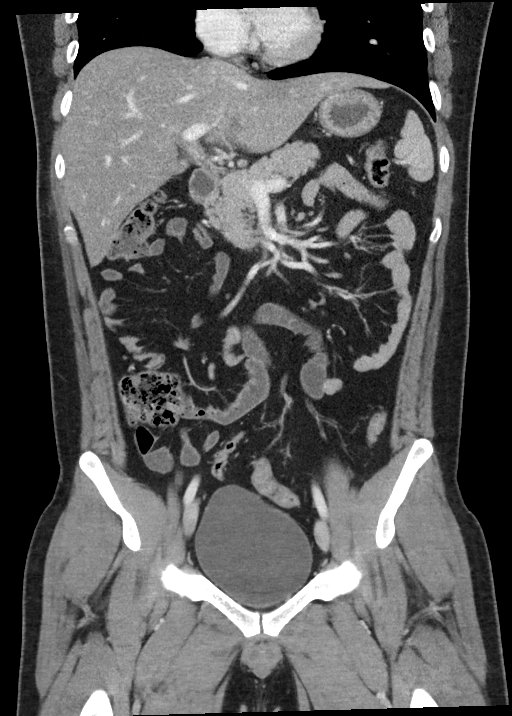
[im 52/93  soft-tissue]
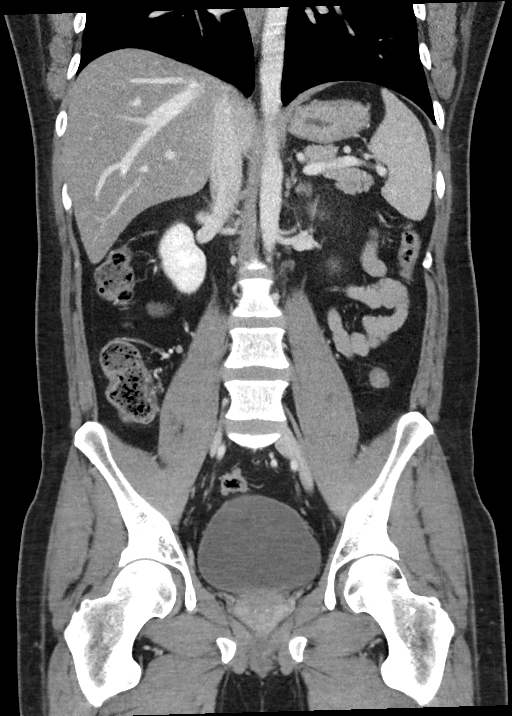

[15 of 46 positions shown; findings below may reference images not displayed]

FINDINGS: Lower chest: Unremarkable

Hepatobiliary: Fatty liver with subcapsular sparing noted along the
gallbladder fossa. No focal abnormality in the liver parenchyma.
Specifically, no intraparenchymal hematoma. No perihepatic fluid or
hemorrhage. Gallbladder is nondistended and there may be some trace
pericholecystic fluid. Somewhat prominent enhancement noted in the
mucosa of the gallbladder and cystic duct, nonspecific. No intra or
extrahepatic biliary duct dilatation.

Pancreas: No focal mass lesion. No dilatation of the main duct. No
intraparenchymal cyst. No peripancreatic edema.

Spleen: No splenomegaly. No focal mass lesion.

Adrenals/Urinary Tract: No adrenal nodule or mass. Kidneys
unremarkable. No evidence for hydroureter. The urinary bladder
appears normal for the degree of distention.

Stomach/Bowel: Stomach is unremarkable. No gastric wall thickening.
No evidence of outlet obstruction. Duodenum is normally positioned
as is the ligament of Treitz. No small bowel wall thickening. No
small bowel dilatation. The terminal ileum is normal. The appendix
is normal. No gross colonic mass. No colonic wall thickening.

Vascular/Lymphatic: No abdominal aortic aneurysm. There is no
gastrohepatic or hepatoduodenal ligament lymphadenopathy. No
retroperitoneal or mesenteric lymphadenopathy. No pelvic sidewall
lymphadenopathy.

Reproductive: The prostate gland and seminal vesicles are
unremarkable.

Other: No intraperitoneal free fluid.

Musculoskeletal: No worrisome lytic or sclerotic osseous
abnormality.
IMPRESSION: 1. No acute findings in the abdomen or pelvis. Specifically, no
evidence for hepatic hematoma or perihepatic fluid or hemorrhage
status post biopsy 4 days ago. No intraperitoneal free fluid.
2. Hepatic steatosis.
3. Somewhat prominent enhancement in the mucosa of the gallbladder
and cystic duct, nonspecific. No gallstones seen on today's study
nor on ultrasound exam of 10/27/2020.

## 2024-01-20 NOTE — Progress Notes (Signed)
 [facility] - [facility]  Chief complaint: No chief complaint on file.   Subjective: Tristan Salinas is a 33 y.o. adult here for acute visit. History of Present Illness Tristan Salinas is a 33 year old with recurrent urinary tract infections who presents with a recent UTI.  They initially sought care at urgent care on January 22, 2024, marking the sixth UTI they have experienced over the years. Symptoms included urinary urgency, dysuria, and a noticeable discharge in the mornings. They were treated with medication, which resolved symptoms for two days before they recurred, accompanied by chills, fever, and nausea. Over-the-counter Azo provided symptom relief.  They have a history of recurrent UTIs dating back to childhood, with episodes occurring since elementary or middle school. They have not previously seen a urologist for these issues. In their teenage years, they experienced testicular pain, which they attributed to growth but now question if it was related to mild infections.  They are currently taking fenofibrate, although the dosage and frequency were not specified. They have not received results from previous tests for sexually transmitted diseases. The patient reported that burning was not present anymore and that discharge only occurred in the mornings.  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 They live in [place] and prefer to receive urology care there.    Current Outpatient Medications:  .  pantoprazole  (PROTONIX ) 40 MG DR tablet, Take 40 mg by mouth once daily, Disp: , Rfl:   ROS reviewed: General ROS:  Negative for  - fatigue, fevers, chills HEENT ROS: negative for seasonal allergies, congestion, cough Respiratory ROS: negative for - cough, SOB, wheezing Cardiovascular ROS: no chest pain or dyspnea on exertion Gastrointestinal ROS: negative for constipation, N/V,D Genito-Urinary ROS: no dysuria,  trouble voiding, or hematuria Musculoskeletal ROS: negative for joint pain, swelling Neurological ROS: negative for headaches, dizzinesss Dermatological ROS: negative for rash or skin lesion    Psychological ROS: negative for depression or anxiety  No Known Allergies  Social History   Tobacco Use  . Smoking status: Former  . Smokeless tobacco: Never  Vaping Use  . Vaping status: Never Used  Substance Use Topics  . Alcohol use: Not Currently  . Drug use: Never      Objective:  There were no vitals taken for this visit. reviewed. Gen: AAOx3. Well-developed and well-nourished. NAD.  HEENT:    HEAD NORMOCEPHALIC.  PERRLA, EOM intact.  No thyromegaly present. No lymphadpathy. Cardiovascular: Normal rate, regular rhythm. Normal S1 and S2 without murmus, rubs or gallops.  Pulmonary/Chest: CTAB. Effort normal and breath sounds normal. No respiratory distress. No wheezes or rales. Abdomen: Soft. Bowel sounds are normal. No distension or tenderness.  Neuro: Cranial nervess II-XII intact.Nonfocal exam. Skin: Skin is warm and dry.  Musculoskeletal: Normal range of motion. No edema, no tenderness.  Psychiatric: Normal mood and affect. Her behavior is normal. Judgment and thought content normal  Assessment and Plan: Assessment & Plan Recurrent urinary tract infections Recurrent UTIs since childhood, recent episode on January 22, 2024. Symptoms included urgency, dysuria, discharge, chills, fever, nausea. Differential includes STIs, anatomical issues, chronic infection. - Order urinalysis for active infection. - Order GC and chlamydia testing. - Refer to urology. - Advise increased fluid intake and daily cranberry juice. - Recommend abstaining from sexual activity. - Suggest Azo for symptom relief.  General Health Maintenance Tetanus vaccination and TB testing required for employment. - Administer tetanus vaccination. - Order QuantiFERON Gold test  for TB screening.   Recurrent UTI  (urinary tract infection)  (primary encounter diagnosis) Plan: Ambulatory Referral to Urology, Urinalysis        w/Microscopic, Xpert CT/NG, PCR - [facility],        Xpert CT/NG, PCR - [facility], CANCELED:        Chlamydia/GC Amplification - [facility]  Depression screening (Z13.31) Plan: Depression Screen -(PHQ- 2/9, BDI), Xpert        CT/NG, PCR - [facility], Xpert CT/NG, PCR -        [facility]  Screening for depression Plan: Xpert CT/NG, PCR - [facility], Xpert CT/NG, PCR -       [facility]  Screening for tuberculosis Plan: QuantiFERON-TB Gold Plus - [facility], Xpert        CT/NG, PCR - [facility], Xpert CT/NG, PCR -        [facility]  Need for Tdap vaccination Plan: Tdap (>=83YR) vaccine (Adacel/Boostrix)(No        Medicare Patients), Xpert CT/NG, PCR -        [facility], Xpert CT/NG, PCR - [facility]        This note has been created using automated tools and reviewed for accuracy by RICHARD LESLIE GILBERT.

## 2024-02-17 ENCOUNTER — Ambulatory Visit (INDEPENDENT_AMBULATORY_CARE_PROVIDER_SITE_OTHER): Admitting: Urology

## 2024-02-17 ENCOUNTER — Encounter: Payer: Self-pay | Admitting: Urology

## 2024-02-17 VITALS — BP 168/121 | HR 90 | Ht 73.0 in | Wt 185.0 lb

## 2024-02-17 DIAGNOSIS — R3911 Hesitancy of micturition: Secondary | ICD-10-CM

## 2024-02-17 DIAGNOSIS — R3912 Poor urinary stream: Secondary | ICD-10-CM | POA: Diagnosis not present

## 2024-02-17 DIAGNOSIS — N390 Urinary tract infection, site not specified: Secondary | ICD-10-CM | POA: Diagnosis not present

## 2024-02-17 LAB — MICROSCOPIC EXAMINATION: RBC, Urine: NONE SEEN /HPF (ref 0–2)

## 2024-02-17 LAB — URINALYSIS, COMPLETE
Bilirubin, UA: NEGATIVE
Glucose, UA: NEGATIVE
Ketones, UA: NEGATIVE
Nitrite, UA: NEGATIVE
Protein,UA: NEGATIVE
RBC, UA: NEGATIVE
Specific Gravity, UA: 1.02 (ref 1.005–1.030)
Urobilinogen, Ur: 0.2 mg/dL (ref 0.2–1.0)
pH, UA: 7.5 (ref 5.0–7.5)

## 2024-02-17 LAB — BLADDER SCAN AMB NON-IMAGING

## 2024-02-17 NOTE — Progress Notes (Signed)
 02/17/2024 12:44 PM   Tristan Salinas 15-Oct-1990 [id number]  Referring provider: Bertrum Charlie CROME, MD [address] [place],  [place] [address]  Chief Complaint  Patient presents with   New Patient (Initial Visit)   Establish Care   Recurrent UTI    HPI: Tristan Salinas is a 33 y.o. male referred for evaluation of recurrent UTI.  Relates to a long history of recurrent urinary tract infections; no prior urologic evaluation States his first infection was in sixth grade and was treated for UTIs x 2 in his teenage years.  He estimates he has been treated for 3 infections in the last year.  As he has aged he is having increasing infections with symptoms consisting of frequency, urgency, dysuria and he has had a febrile UTI In Epic review he is only had 1 urine culture back in 2018 which was positive for Enterococcus faecalis.  He states his most recent infections have been treated in urgent cares.  He is only aware of 1 culture being ordered however he states he never heard the results. Presently asymptomatic but has baseline voiding symptoms of urinary frequency, hesitancy and decreased force and caliber of his urinary stream. CT abdomen pelvis in 2022 showed no hydronephrosis, urinary calculi or GU abnormalities   PMH: Past Medical History:  Diagnosis Date   COVID-19    Family history of adverse reaction to anesthesia    Mother - slow to wake   GERD (gastroesophageal reflux disease)    Hypertension     Surgical History: Past Surgical History:  Procedure Laterality Date   BIOPSY N/A 12/08/2020   Procedure: BIOPSY;  Surgeon: Janalyn Keene NOVAK, MD;  Location: [facility];  Service: Endoscopy;  Laterality: N/A;   ESOPHAGOGASTRODUODENOSCOPY (EGD) WITH PROPOFOL  N/A 12/08/2020   Procedure: ESOPHAGOGASTRODUODENOSCOPY (EGD) WITH PROPOFOL ;  Surgeon: Janalyn Keene NOVAK, MD;  Location: [facility];  Service: Endoscopy;  Laterality: N/A;    Home Medications:   Allergies as of 02/17/2024   No Known Allergies      Medication List        Accurate as of February 17, 2024 12:44 PM. If you have any questions, ask your nurse or doctor.          STOP taking these medications    cyclobenzaprine  5 MG tablet Commonly known as: FLEXERIL  Stopped by: [name]   ibuprofen  800 MG tablet Commonly known as: ADVIL  Stopped by: [name]   omeprazole  20 MG capsule Commonly known as: PRILOSEC Stopped by: [name]        Allergies: No Known Allergies  Family History: Family History  Problem Relation Age of Onset   Diabetes Mother    Diabetes Maternal Grandmother     Social History:  reports that he quit smoking about 3 years ago. His smoking use included cigarettes and e-cigarettes. He started smoking about 4 years ago. He has never used smokeless tobacco. He reports that he does not currently use alcohol. He reports that he does not currently use drugs.   Physical Exam: BP (!) 168/121 (BP Location: Left Arm, Patient Position: Sitting, Cuff Size: Normal)   Pulse 90   Ht 6' 1 (1.854 m)   Wt 185 lb (83.9 kg)   SpO2 100%   BMI 24.41 kg/m   Constitutional:  Alert, No acute distress. HEENT: [place] AT Respiratory: Normal respiratory effort, no increased work of breathing. Psychiatric: Normal mood and affect.  Laboratory Data:  Urinalysis Appearance: Cloudy Dipstick: Trace  leukocytes Microscopy: 11-30 WBC   Assessment & Plan:    1. Recurrent UTI (Primary) No recent culture documentation PVR today 117 mL Recommend further evaluation with CT abdomen pelvis with contrast We discussed probable recommendation of cystoscopy however will await review of CT UA today with pyuria but asymptomatic; urine C&S ordered  2.  Lower urinary tract symptoms Baseline obstructive voiding symptoms Cystoscopy recommendation as above   [name], MD  [facility] [address] [place], [place] [address] [phone]

## 2024-02-20 LAB — CULTURE, URINE COMPREHENSIVE

## 2024-02-22 ENCOUNTER — Ambulatory Visit: Payer: Self-pay | Admitting: Urology

## 2024-03-02 ENCOUNTER — Ambulatory Visit
Admission: RE | Admit: 2024-03-02 | Discharge: 2024-03-02 | Disposition: A | Discharge status: Home / Self Care | Source: Ambulatory Visit | Attending: Urology | Admitting: Urology

## 2024-03-02 DIAGNOSIS — N390 Urinary tract infection, site not specified: Secondary | ICD-10-CM | POA: Insufficient documentation

## 2024-03-02 MED ORDER — IOHEXOL 300 MG/ML  SOLN
100.0000 mL | Freq: Once | INTRAMUSCULAR | Status: AC | PRN
  Administered 2024-03-02: 100 mL via INTRAVENOUS

## 2024-04-16 ENCOUNTER — Encounter: Payer: Self-pay | Admitting: Urology

## 2024-04-16 ENCOUNTER — Ambulatory Visit (INDEPENDENT_AMBULATORY_CARE_PROVIDER_SITE_OTHER): Admitting: Urology

## 2024-04-16 VITALS — BP 156/113 | HR 87 | Ht 73.0 in | Wt 195.0 lb

## 2024-04-16 DIAGNOSIS — N390 Urinary tract infection, site not specified: Secondary | ICD-10-CM | POA: Diagnosis not present

## 2024-04-16 DIAGNOSIS — R3912 Poor urinary stream: Secondary | ICD-10-CM

## 2024-04-16 MED ORDER — ALFUZOSIN HCL ER 10 MG PO TB24: 10.0000 mg | ORAL_TABLET | Freq: Every day | ORAL | 0 refills | Status: AC

## 2024-04-16 NOTE — Progress Notes (Signed)
" ° °  04/16/2024  CC:  Chief Complaint  Patient presents with   Cysto    HPI: Refer to prior office note 02/17/2024.  CT abdomen/pelvis 03/02/2024 showed no upper tract abnormalities. Prostate volume calculated 36 cc (ellipsoid).  UA today negative  Blood pressure (!) 156/113, pulse 87, height 6' 1 (1.854 m), weight 195 lb (88.5 kg).   Cystoscopy Procedure Note  Patient identification was confirmed, informed consent was obtained, and patient was prepped using Betadine solution.  Lidocaine  jelly was administered per urethral meatus.     Pre-Procedure: - Inspection reveals a normal caliber urethral meatus.  Procedure: The flexible cystoscope was introduced without difficulty - No urethral strictures/lesions are present. - Normal prostate  - Mild elevation bladder neck - Bilateral ureteral orifices identified - Bladder mucosa  reveals no ulcers, tumors, or lesions - No bladder stones - No trabeculation  Retroflexion shows no abnormalities   Post-Procedure: - Patient tolerated the procedure well  Assessment/ Plan: No abnormalities on cystoscopy Trial alfuzosin  10 mg daily Follow-up 4-6 weeks repeat symptom check    Tristan JAYSON Barba, MD "

## 2024-04-17 LAB — URINALYSIS, COMPLETE
Bilirubin, UA: NEGATIVE
Glucose, UA: NEGATIVE
Ketones, UA: NEGATIVE
Leukocytes,UA: NEGATIVE
Nitrite, UA: NEGATIVE
Protein,UA: NEGATIVE
RBC, UA: NEGATIVE
Specific Gravity, UA: 1.025 (ref 1.005–1.030)
Urobilinogen, Ur: 0.2 mg/dL (ref 0.2–1.0)
pH, UA: 5.5 (ref 5.0–7.5)

## 2024-04-17 LAB — MICROSCOPIC EXAMINATION

## 2024-05-28 ENCOUNTER — Ambulatory Visit: Admitting: Physician Assistant
